# Patient Record
Sex: Male | Born: 1960 | Hispanic: No | Marital: Married | Smoking: Current some day smoker
Health system: Southern US, Community
[De-identification: ages and names within clinical notes are randomized; demographics above are authoritative.]

## PROBLEM LIST (undated history)

## (undated) DIAGNOSIS — A539 Syphilis, unspecified: Secondary | ICD-10-CM

## (undated) DIAGNOSIS — J189 Pneumonia, unspecified organism: Secondary | ICD-10-CM

## (undated) DIAGNOSIS — M1612 Unilateral primary osteoarthritis, left hip: Secondary | ICD-10-CM

## (undated) DIAGNOSIS — B20 Human immunodeficiency virus [HIV] disease: Secondary | ICD-10-CM

## (undated) DIAGNOSIS — B59 Pneumocystosis: Secondary | ICD-10-CM

## (undated) DIAGNOSIS — R739 Hyperglycemia, unspecified: Secondary | ICD-10-CM

## (undated) DIAGNOSIS — R7303 Prediabetes: Secondary | ICD-10-CM

## (undated) DIAGNOSIS — I10 Essential (primary) hypertension: Secondary | ICD-10-CM

## (undated) DIAGNOSIS — Z21 Asymptomatic human immunodeficiency virus [HIV] infection status: Secondary | ICD-10-CM

## (undated) HISTORY — PX: JOINT REPLACEMENT: SHX530

---

## 2018-12-15 ENCOUNTER — Other Ambulatory Visit: Payer: Self-pay | Admitting: Internal Medicine

## 2018-12-15 DIAGNOSIS — J8489 Other specified interstitial pulmonary diseases: Secondary | ICD-10-CM

## 2018-12-21 DIAGNOSIS — B59 Pneumocystosis: Secondary | ICD-10-CM | POA: Insufficient documentation

## 2018-12-24 ENCOUNTER — Ambulatory Visit: Admission: RE | Admit: 2018-12-24 | Payer: 59 | Source: Ambulatory Visit

## 2018-12-30 ENCOUNTER — Other Ambulatory Visit: Payer: Self-pay | Admitting: Infectious Diseases

## 2018-12-30 ENCOUNTER — Other Ambulatory Visit (HOSPITAL_COMMUNITY): Payer: Self-pay | Admitting: Infectious Diseases

## 2018-12-30 ENCOUNTER — Other Ambulatory Visit: Payer: Self-pay

## 2018-12-30 ENCOUNTER — Ambulatory Visit
Admission: RE | Admit: 2018-12-30 | Discharge: 2018-12-30 | Disposition: A | Discharge status: Home / Self Care | Payer: 59 | Source: Ambulatory Visit | Attending: Infectious Diseases | Admitting: Infectious Diseases

## 2018-12-30 DIAGNOSIS — B59 Pneumocystosis: Secondary | ICD-10-CM | POA: Insufficient documentation

## 2018-12-30 DIAGNOSIS — B20 Human immunodeficiency virus [HIV] disease: Secondary | ICD-10-CM | POA: Diagnosis present

## 2019-12-26 ENCOUNTER — Other Ambulatory Visit: Payer: 59 | Attending: Internal Medicine

## 2019-12-27 ENCOUNTER — Other Ambulatory Visit: Payer: Self-pay

## 2019-12-27 ENCOUNTER — Encounter: Payer: Self-pay | Admitting: Internal Medicine

## 2019-12-27 DIAGNOSIS — B20 Human immunodeficiency virus [HIV] disease: Secondary | ICD-10-CM | POA: Insufficient documentation

## 2019-12-27 DIAGNOSIS — A539 Syphilis, unspecified: Secondary | ICD-10-CM | POA: Insufficient documentation

## 2019-12-28 ENCOUNTER — Encounter: Admission: RE | Payer: Self-pay | Source: Home / Self Care

## 2019-12-28 ENCOUNTER — Ambulatory Visit: Admission: RE | Admit: 2019-12-28 | Payer: 59 | Source: Home / Self Care | Admitting: Internal Medicine

## 2019-12-28 HISTORY — DX: Syphilis, unspecified: A53.9

## 2019-12-28 HISTORY — DX: Asymptomatic human immunodeficiency virus (hiv) infection status: Z21

## 2019-12-28 HISTORY — DX: Human immunodeficiency virus (HIV) disease: B20

## 2019-12-28 SURGERY — COLONOSCOPY
Anesthesia: General

## 2020-09-10 IMAGING — CT CT CHEST W/O CM
2 of 3 series · 14 of 36 positions shown, 17 images · non-contrast
Comparison: Report from 12/10/2018 chest radiograph (images not
available).

CLINICAL DATA: Reported pneumonia for 2-3 weeks treated with
antibiotics. Cough and chest pain of improved. Nausea, vomiting and
anorexia persist. Former smoker. History of HIV.

EXAM:
CT CHEST WITHOUT CONTRAST
TECHNIQUE: Multidetector CT imaging of the chest was performed following the
standard protocol without IV contrast.

[Series 2: thorax · axial · 0.70mm/px · z∈[-301,-63]mm · 11 of 141 slices shown, 14 images]
[im 11/141  mediastinal]
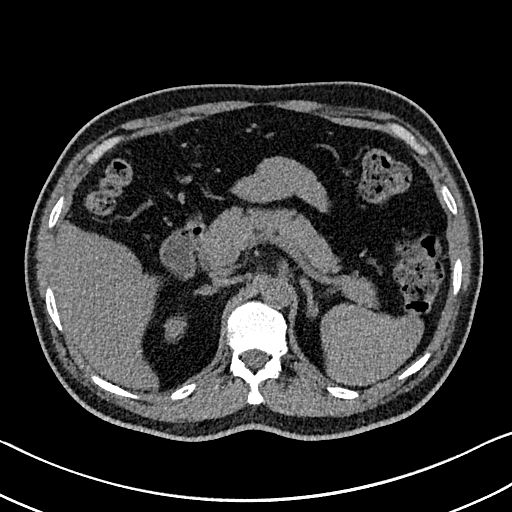
[im 11/141  lung]
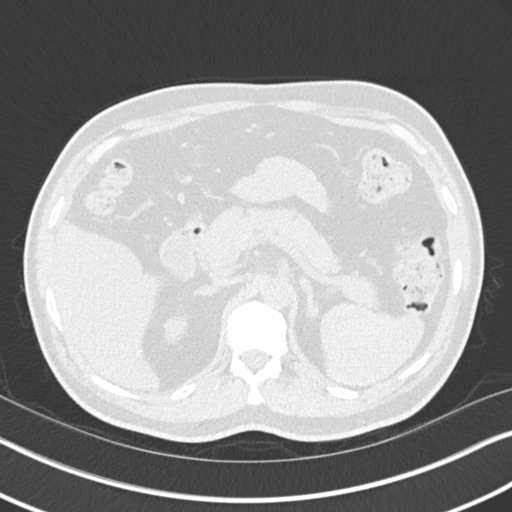
[im 21/141  lung]
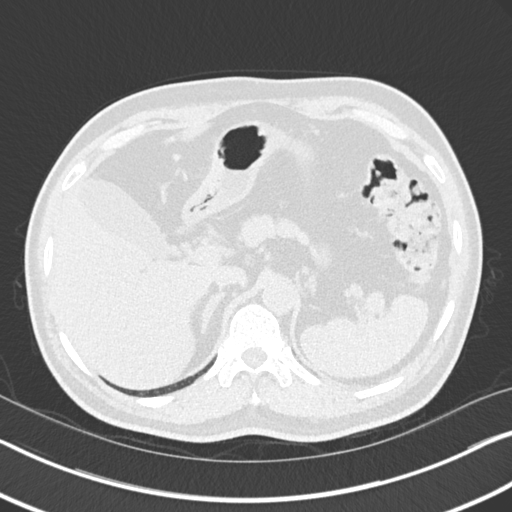
[im 32/141  lung]
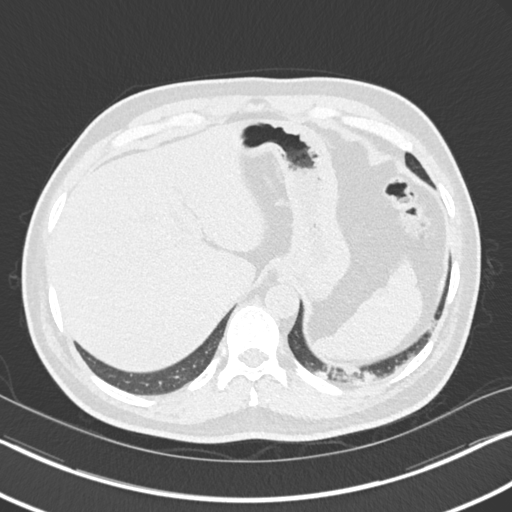
[im 47/141  lung]
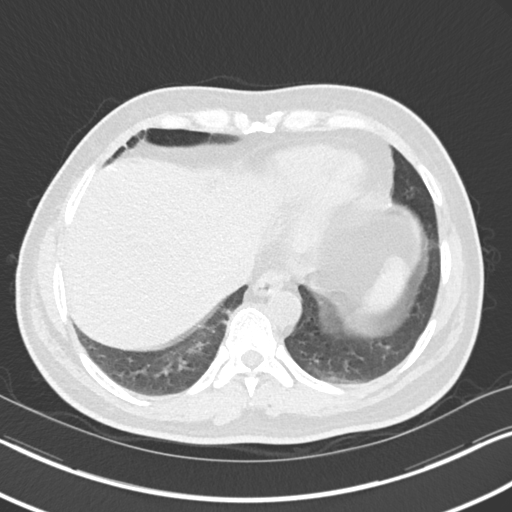
[im 58/141  mediastinal]
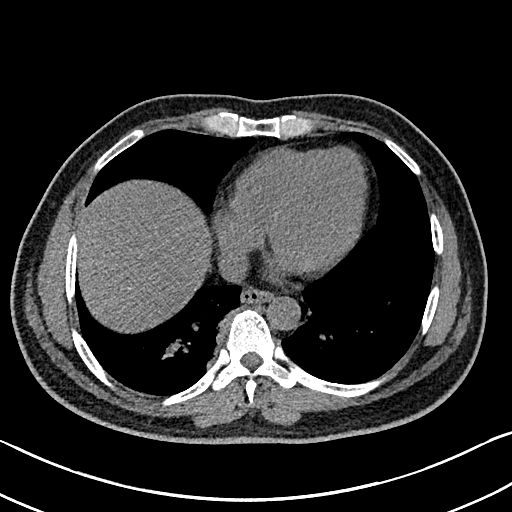
[im 58/141  lung]
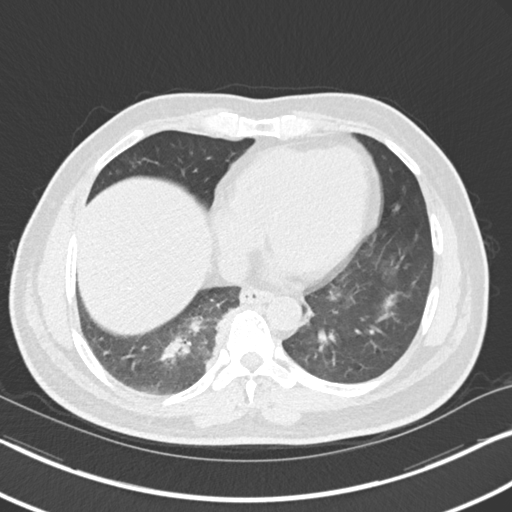
[im 73/141  lung]
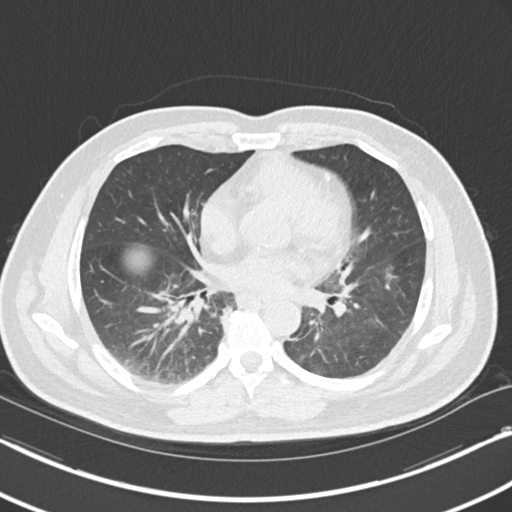
[im 83/141  lung]
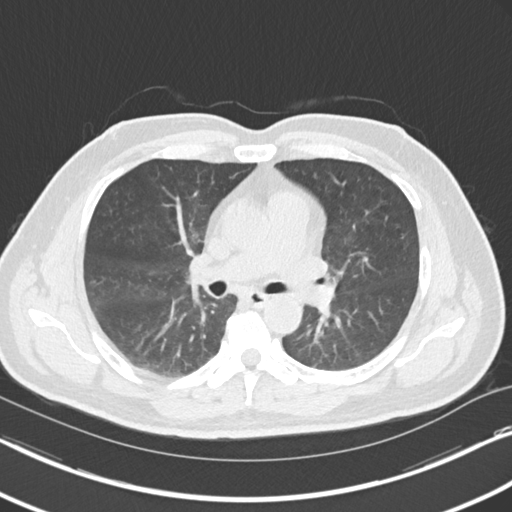
[im 94/141  lung]
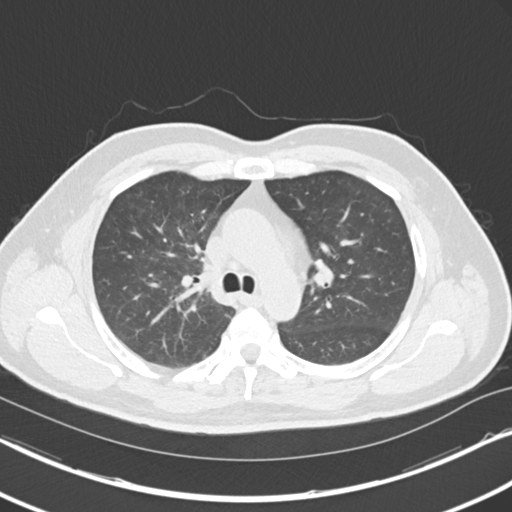
[im 109/141  mediastinal]
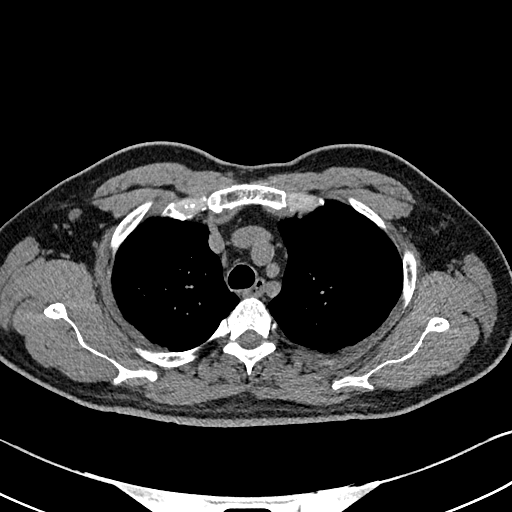
[im 109/141  lung]
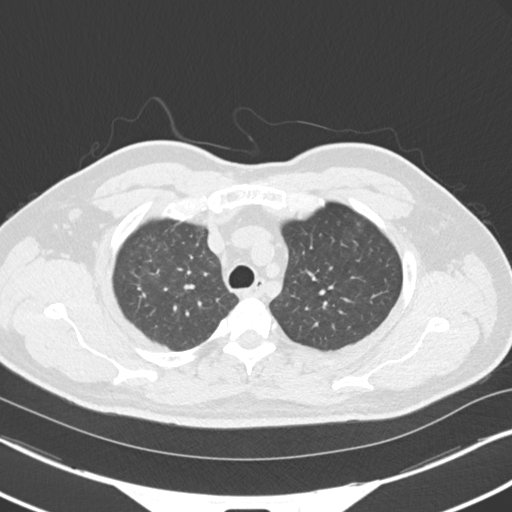
[im 120/141  lung]
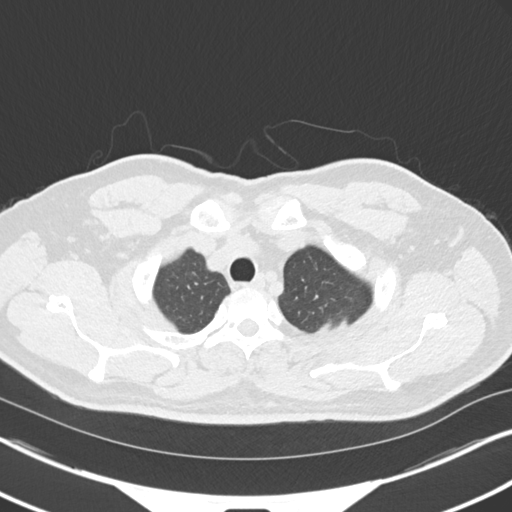
[im 130/141  lung]
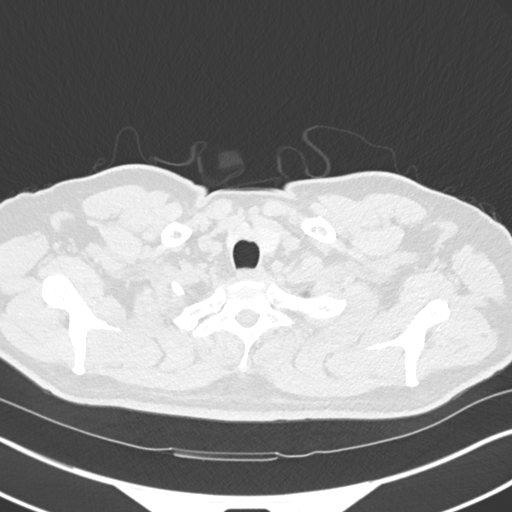

[Series 5: coronal · coronal · 0.56mm/px · 3 of 131 slices shown]
[im 27/131  lung]
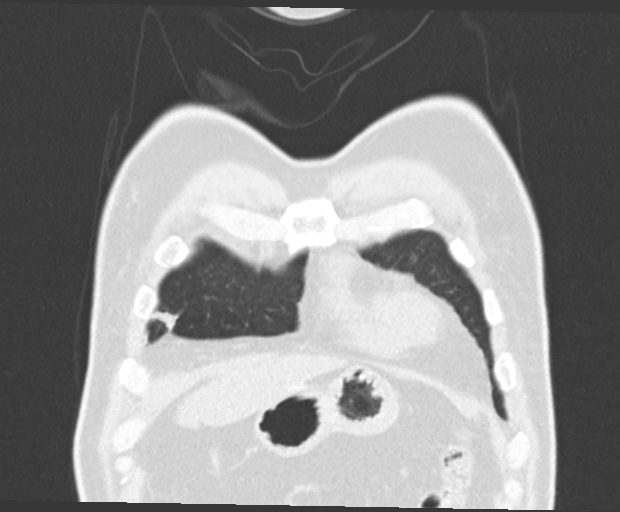
[im 53/131  lung]
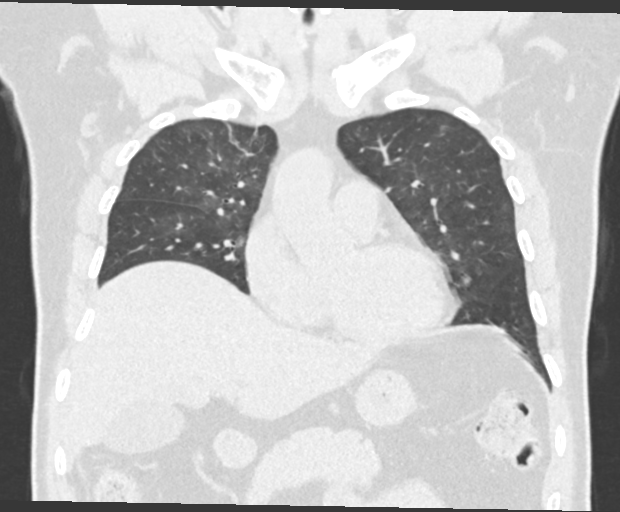
[im 79/131  lung]
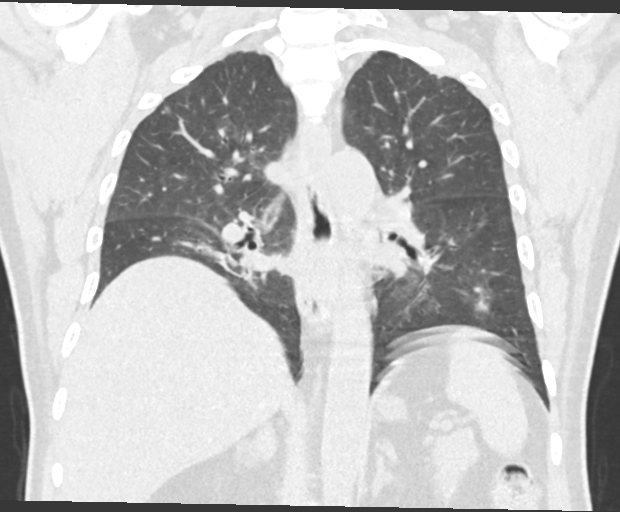

[14 of 36 positions shown; findings below may reference images not displayed]

FINDINGS: Cardiovascular: Normal heart size. No significant pericardial
effusion/thickening. Mildly atherosclerotic nonaneurysmal thoracic
aorta. Normal caliber pulmonary arteries.

Mediastinum/Nodes: No discrete thyroid nodules. Unremarkable
esophagus. Mildly enlarged bilateral axillary lymph nodes, largest
1.0 cm on the right (series 2/image 10) and 1.1 cm on the left
(series 2/image 9). No pathologically enlarged mediastinal or
discrete hilar nodes on this noncontrast scan.

Lungs/Pleura: No pneumothorax. No pleural effusion. There is diffuse
bronchial wall thickening. There is patchy peribronchovascular
ground-glass opacity throughout both lungs involving all lung lobes,
slightly asymmetrically prominent in the right lung, with mild
patchy peribronchovascular consolidation in the right lower lobe and
inferior right middle lobe (series 3/image 89). Bandlike patchy foci
of consolidation in the medial right lower lobe and basilar left
lower lobe. Apical right upper lobe 6 mm solid pulmonary nodule
(series 3/image 30).

Upper abdomen: Mild diffuse hepatic steatosis.

Musculoskeletal: No aggressive appearing focal osseous lesions. Mild
thoracic spondylosis.
IMPRESSION: 1. Diffuse bronchial wall thickening. Patchy peribronchovascular
ground-glass opacity throughout both lungs involving all lung lobes,
slightly asymmetrically prominent in the right lung. Small patchy
foci of consolidation in the right middle lobe and bilateral lower
lobes. Findings are most suggestive of a nonspecific multilobar
bronchopneumonia, with atypical etiologies considered including
pneumocystis pneumonia given the reported history of HIV.
2. Nonspecific mild bilateral axillary lymphadenopathy, potentially
reactive given the reported history of HIV.
3. Apical right upper lobe 6 mm solid pulmonary nodule. Non-contrast
chest CT at 6-12 months is recommended. If the nodule is stable at
time of repeat CT, then future CT at 18-24 months (from today's
scan) is considered optional for low-risk patients, but is
recommended for high-risk patients. This recommendation follows the
consensus statement: Guidelines for Management of Incidental
Pulmonary Nodules Detected on CT Images:From the [HOSPITAL]
7629; published online before print (10.1148/radiol.6112191947).
4. Diffuse hepatic steatosis.

Aortic Atherosclerosis ([id number]).

## 2021-09-05 ENCOUNTER — Other Ambulatory Visit: Payer: Self-pay | Admitting: Surgery

## 2021-09-10 ENCOUNTER — Other Ambulatory Visit: Payer: Self-pay

## 2021-09-10 ENCOUNTER — Encounter
Admission: RE | Admit: 2021-09-10 | Discharge: 2021-09-10 | Disposition: A | Discharge status: Home / Self Care | Payer: 59 | Source: Ambulatory Visit | Attending: Surgery | Admitting: Surgery

## 2021-09-10 VITALS — BP 137/87 | HR 66 | Temp 98.2°F | Resp 18 | Ht 65.0 in | Wt 171.2 lb

## 2021-09-10 DIAGNOSIS — Z21 Asymptomatic human immunodeficiency virus [HIV] infection status: Secondary | ICD-10-CM | POA: Diagnosis not present

## 2021-09-10 DIAGNOSIS — Z01818 Encounter for other preprocedural examination: Secondary | ICD-10-CM | POA: Diagnosis present

## 2021-09-10 DIAGNOSIS — M87051 Idiopathic aseptic necrosis of right femur: Secondary | ICD-10-CM | POA: Insufficient documentation

## 2021-09-10 LAB — COMPREHENSIVE METABOLIC PANEL
ALT: 18 U/L (ref 0–44)
AST: 17 U/L (ref 15–41)
Albumin: 4.2 g/dL (ref 3.5–5.0)
Alkaline Phosphatase: 87 U/L (ref 38–126)
Anion gap: 10 (ref 5–15)
BUN: 17 mg/dL (ref 6–20)
CO2: 26 mmol/L (ref 22–32)
Calcium: 9.6 mg/dL (ref 8.9–10.3)
Chloride: 105 mmol/L (ref 98–111)
Creatinine, Ser: 1.65 mg/dL — ABNORMAL HIGH (ref 0.61–1.24)
GFR, Estimated: 47 mL/min — ABNORMAL LOW (ref >60)
Glucose, Bld: 93 mg/dL (ref 70–99)
Potassium: 4 mmol/L (ref 3.5–5.1)
Sodium: 141 mmol/L (ref 135–145)
Total Bilirubin: 0.8 mg/dL (ref 0.3–1.2)
Total Protein: 8.7 g/dL — ABNORMAL HIGH (ref 6.5–8.1)

## 2021-09-10 LAB — CBC WITH DIFFERENTIAL/PLATELET
Abs Immature Granulocytes: 0.01 10*3/uL (ref 0.00–0.07)
Basophils Absolute: 0 10*3/uL (ref 0.0–0.1)
Basophils Relative: 1 %
Eosinophils Absolute: 0.1 10*3/uL (ref 0.0–0.5)
Eosinophils Relative: 2 %
HCT: 41.3 % (ref 39.0–52.0)
Hemoglobin: 14.1 g/dL (ref 13.0–17.0)
Immature Granulocytes: 0 %
Lymphocytes Relative: 26 %
Lymphs Abs: 1.6 10*3/uL (ref 0.7–4.0)
MCH: 30.2 pg (ref 26.0–34.0)
MCHC: 34.1 g/dL (ref 30.0–36.0)
MCV: 88.4 fL (ref 80.0–100.0)
Monocytes Absolute: 0.5 10*3/uL (ref 0.1–1.0)
Monocytes Relative: 8 %
Neutro Abs: 3.8 10*3/uL (ref 1.7–7.7)
Neutrophils Relative %: 63 %
Platelets: 263 10*3/uL (ref 150–400)
RBC: 4.67 MIL/uL (ref 4.22–5.81)
RDW: 12.9 % (ref 11.5–15.5)
WBC: 6 10*3/uL (ref 4.0–10.5)
nRBC: 0 % (ref 0.0–0.2)

## 2021-09-10 LAB — URINALYSIS, ROUTINE W REFLEX MICROSCOPIC
Bacteria, UA: NONE SEEN
Glucose, UA: NEGATIVE mg/dL
Hgb urine dipstick: NEGATIVE
Ketones, ur: NEGATIVE mg/dL
Leukocytes,Ua: NEGATIVE
Nitrite: NEGATIVE
Protein, ur: 100 mg/dL — AB
Specific Gravity, Urine: 1.02 (ref 1.005–1.030)
pH: 5 (ref 5.0–8.0)

## 2021-09-10 LAB — SURGICAL PCR SCREEN
MRSA, PCR: NEGATIVE
Staphylococcus aureus: NEGATIVE

## 2021-09-10 LAB — TYPE AND SCREEN
ABO/RH(D): O POS
Antibody Screen: NEGATIVE

## 2021-09-10 NOTE — Patient Instructions (Addendum)
Your procedure is scheduled on: Thursday 09/12/21 Report to the Registration Desk on the 1st floor of the [facility]. To find out your arrival time, please call [phone] between 1PM - 3PM on: Wednesday 09/11/21 If your arrival time is 6:00 am, do not arrive prior to that time as the [facility] entrance doors do not open until 6:00 am.  REMEMBER: Instructions that are not followed completely may result in serious medical risk, up to and including death; or upon the discretion of your surgeon and anesthesiologist your surgery may need to be rescheduled.  Do not eat food after midnight the night before surgery.  No gum chewing, lozengers or hard candies.  You may however, drink CLEAR liquids up to 2 hours before you are scheduled to arrive for your surgery. Do not drink anything within 2 hours of your scheduled arrival time.  Clear liquids include: - water  - apple juice without pulp - gatorade (not RED colors) - black coffee or tea (Do NOT add milk or creamers to the coffee or tea) Do NOT drink anything that is not on this list.  In addition, your doctor has ordered for you to drink the provided  Ensure Pre-Surgery Clear Carbohydrate Drink  Drinking this carbohydrate drink up to two hours before surgery helps to reduce insulin resistance and improve patient outcomes. Please complete drinking 2 hours prior to scheduled arrival time.  TAKE THESE MEDICATIONS THE MORNING OF SURGERY WITH A SIP OF WATER: AMLODIPINE BESYLATE PO  One week prior to surgery: Stop Anti-inflammatories (NSAIDS) such as Advil, Aleve, Ibuprofen, Motrin, Naproxen, Naprosyn and Aspirin based products such as Excedrin, Goodys Powder, BC Powder.  Stop taking your etodolac (LODINE) 500 MG tablet and ANY OVER THE COUNTER supplements until after surgery.  You may however, continue to take Tylenol if needed for pain up until the day of surgery.  No Alcohol for 24 hours before or after surgery.  No Smoking including  e-cigarettes for 24 hours prior to surgery.  No chewable tobacco products for at least 6 hours prior to surgery.  No nicotine patches on the day of surgery.  Do not use any "recreational" drugs for at least a week prior to your surgery.  Please be advised that the combination of cocaine and anesthesia may have negative outcomes, up to and including death. If you test positive for cocaine, your surgery will be cancelled.  On the morning of surgery brush your teeth with toothpaste and water, you may rinse your mouth with mouthwash if you wish. Do not swallow any toothpaste or mouthwash.  Use CHG Soap as directed on instruction sheet.  Do not wear jewelry.  Do not wear lotions, powders, or colognes.   Do not shave body from the neck down 48 hours prior to surgery just in case you cut yourself which could leave a site for infection.  Also, freshly shaved skin may become irritated if using the CHG soap.  dentures may not be worn into surgery.  Do not bring valuables to the hospital. [facility] is not responsible for any missing/lost belongings or valuables.   Notify your doctor if there is any change in your medical condition (cold, fever, infection).  Wear comfortable clothing (specific to your surgery type) to the hospital.  After surgery, you can help prevent lung complications by doing breathing exercises.  Take deep breaths and cough every 1-2 hours. Your doctor may order a device called an Incentive Spirometer to help you take deep breaths.  If you are being admitted to the hospital overnight, leave your suitcase in the car. After surgery it may be brought to your room.  If you are being discharged the day of surgery, you will not be allowed to drive home. You will need a responsible adult (18 years or older) to drive you home and stay with you that night.   If you are taking public transportation, you will need to have a responsible adult (18 years or older) with you. Please  confirm with your physician that it is acceptable to use public transportation.   Please call the Pre-admissions Testing Dept. at [phone] if you have any questions about these instructions.  Surgery Visitation Policy:  Patients undergoing a surgery or procedure may have two family members or support persons with them as long as the person is not COVID-19 positive or experiencing its symptoms.   Inpatient Visitation:    Visiting hours are 7 a.m. to 8 p.m. Up to four visitors are allowed at one time in a patient room, including children. The visitors may rotate out with other people during the day. One designated support person (adult) may remain overnight.

## 2021-09-10 NOTE — Progress Notes (Signed)
  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 Leron Croak, MD. in preparation for his procedure, patient presented to the PAT clinic on 09/10/2021 for preoperative labs and ECG.  In review of the ECG, patient with presumably acute lateral TWIs (1, aVL, V4-V6).  Unfortunately, there are no previous ECG tracings available in the [facility] or [facility] systems for comparison. In brief review, it was noted that patient complained of exertional dyspnea when establishing care with Dr. Marcello Fennel, MD back on 07/06/2018.  Stress test was recommended at that time, however I do not see evidence of any noninvasive cardiovascular testing.   Patient with an HIV diagnosis.  Care was transitioned to Dr. Clydie Braun, MD.     Impression and Plan:  Donald Barnett noted to have presumably acute lateral ECG changes on preoperative ECG.  Patient does not see cardiology.  Copy of ECG forwarded to Dr. Sampson Goon for review.  We are requesting recommendations regarding whether patient is deemed to be appropriate to proceed with planned elective surgical intervention, or if he should be seen by cardiology for further evaluation and clearance prior to proceeding.    Spoke to internal medicine office staff around 1700 today and learned that MD already out of the office for the day. Plans are to attempt to get patient into the clinic tomorrow to see Dr. Sampson Goon and/or cardiology for evaluation.  Disposition regarding surgery pending.   No changes being made to the surgical schedule at this time pending decision from medicine or cardiology. Copy of this note forwarded to primary attending surgeon Joice Lofts, MD) in efforts to ensure interdisciplinary communication.  Quentin Mulling, MSN, APRN, FNP-C, CEN [facility]  Peri-operative Services Nurse Practitioner Phone: [phone] 09/10/21 5:51 PM  NOTE: This note has been prepared using Dragon dictation software. Despite my best ability to proofread, there is always the potential that unintentional transcriptional errors may still occur from this process.

## 2021-09-11 MED ORDER — LACTATED RINGERS IV SOLN: INTRAVENOUS | Status: DC

## 2021-09-11 MED ORDER — ORAL CARE MOUTH RINSE: 15.0000 mL | Freq: Once | OROMUCOSAL | Status: AC

## 2021-09-11 MED ORDER — CHLORHEXIDINE GLUCONATE 0.12 % MT SOLN: 15.0000 mL | Freq: Once | OROMUCOSAL | Status: AC

## 2021-09-11 MED ORDER — CEFAZOLIN SODIUM-DEXTROSE 2-4 GM/100ML-% IV SOLN
2.0000 g | INTRAVENOUS | Status: AC
  Administered 2021-09-12: 2 g via INTRAVENOUS

## 2021-09-11 MED ORDER — FAMOTIDINE 20 MG PO TABS: 20.0000 mg | ORAL_TABLET | Freq: Once | ORAL | Status: AC

## 2021-09-11 NOTE — Progress Notes (Addendum)
  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 Milagros Evener, MD. in preparation for his procedure, patient presented to the PAT clinic on 09/10/2021 for preoperative testing.  Lateral changes noted to patient's preoperative ECG, however there was no previous tracing available for comparison.  Tracing was sent to PCP for review, as patient has no cardiologist.  PCP elected to meet with patient via telehealth visit today to discuss ECG and determine presence of any symptoms.  Patient met with Dr. Adrian Prows, MD on 09/11/2021.  MD reviewed ECG changes with patient.  Per Dr. Ola Spurr, "patient has no cardiopulmonary complaints.  He is able to work carrying 30 to 40 pounds of weight 10 m repeatedly as part of his job.  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  No current concern for further cardiac work-up.  Patient is optimized for surgery and may proceed at an overall LOW risk of significant perioperative cardiovascular complications".  Impression and Plan:  [name] with presumably acute lateral ECG changes.  Patient met with his internal medicine provider today via telehealth visit.  Patient asymptomatic from a cardiovascular perspective.  Patient has been cleared at an LOW risk for significant cardiovascular complications.  Will update primary attending surgeons office on clearance and plans to proceed as previously scheduled.  [name], MSN, APRN, FNP-C, CEN [facility]  Peri-operative Services Nurse Practitioner Phone: [phone] 09/11/21 1:41 PM  NOTE: This note has been prepared using Dragon dictation software. Despite my best ability to proofread, there is always the potential that unintentional transcriptional errors may still occur from this process.

## 2021-09-12 ENCOUNTER — Other Ambulatory Visit: Payer: Self-pay

## 2021-09-12 ENCOUNTER — Encounter
Admission: RE | Disposition: A | Discharge status: Home Health | Payer: Self-pay | Source: Home / Self Care | Attending: Surgery

## 2021-09-12 ENCOUNTER — Observation Stay
Admission: RE | Admit: 2021-09-12 | Discharge: 2021-09-13 | Disposition: A | Discharge status: Home Health | Payer: 59 | Attending: Surgery | Admitting: Surgery

## 2021-09-12 ENCOUNTER — Observation Stay: Payer: 59

## 2021-09-12 ENCOUNTER — Ambulatory Visit: Payer: 59 | Admitting: Urgent Care

## 2021-09-12 DIAGNOSIS — Z79899 Other long term (current) drug therapy: Secondary | ICD-10-CM | POA: Insufficient documentation

## 2021-09-12 DIAGNOSIS — Z96641 Presence of right artificial hip joint: Secondary | ICD-10-CM

## 2021-09-12 DIAGNOSIS — M87051 Idiopathic aseptic necrosis of right femur: Secondary | ICD-10-CM | POA: Diagnosis present

## 2021-09-12 DIAGNOSIS — F17210 Nicotine dependence, cigarettes, uncomplicated: Secondary | ICD-10-CM | POA: Diagnosis not present

## 2021-09-12 DIAGNOSIS — B20 Human immunodeficiency virus [HIV] disease: Secondary | ICD-10-CM | POA: Insufficient documentation

## 2021-09-12 HISTORY — PX: TOTAL HIP ARTHROPLASTY: SHX124

## 2021-09-12 LAB — ABO/RH: ABO/RH(D): O POS

## 2021-09-12 SURGERY — ARTHROPLASTY, HIP, TOTAL,POSTERIOR APPROACH
Anesthesia: Spinal | Site: Hip | Laterality: Right

## 2021-09-12 MED ORDER — SODIUM CHLORIDE 0.9 % IR SOLN
Status: DC | PRN
  Administered 2021-09-12: 3000 mL

## 2021-09-12 MED ORDER — CEFAZOLIN SODIUM-DEXTROSE 2-4 GM/100ML-% IV SOLN
2.0000 g | Freq: Four times a day (QID) | INTRAVENOUS | Status: AC
  Administered 2021-09-12 – 2021-09-13 (×3): 2 g via INTRAVENOUS
  Filled 2021-09-12 (×2): qty 100

## 2021-09-12 MED ORDER — METOCLOPRAMIDE HCL 10 MG PO TABS: 5.0000 mg | ORAL_TABLET | Freq: Three times a day (TID) | ORAL | Status: DC | PRN

## 2021-09-12 MED ORDER — SODIUM CHLORIDE (PF) 0.9 % IJ SOLN
INTRAMUSCULAR | Status: DC | PRN
  Administered 2021-09-12: 90 mL

## 2021-09-12 MED ORDER — AMLODIPINE BESYLATE 5 MG PO TABS
5.0000 mg | ORAL_TABLET | Freq: Every day | ORAL | Status: DC
  Administered 2021-09-13: 5 mg via ORAL
  Filled 2021-09-12: qty 1

## 2021-09-12 MED ORDER — PHENYLEPHRINE HCL-NACL 20-0.9 MG/250ML-% IV SOLN
INTRAVENOUS | Status: AC
  Filled 2021-09-12: qty 250

## 2021-09-12 MED ORDER — LIDOCAINE HCL (PF) 2 % IJ SOLN
INTRAMUSCULAR | Status: AC
  Filled 2021-09-12: qty 5

## 2021-09-12 MED ORDER — DOCUSATE SODIUM 100 MG PO CAPS
100.0000 mg | ORAL_CAPSULE | Freq: Two times a day (BID) | ORAL | Status: DC
  Administered 2021-09-12 – 2021-09-13 (×2): 100 mg via ORAL
  Filled 2021-09-12 (×2): qty 1

## 2021-09-12 MED ORDER — MAGNESIUM HYDROXIDE 400 MG/5ML PO SUSP: 30.0000 mL | Freq: Every day | ORAL | Status: DC | PRN

## 2021-09-12 MED ORDER — ACETAMINOPHEN 500 MG PO TABS
1000.0000 mg | ORAL_TABLET | Freq: Four times a day (QID) | ORAL | Status: DC
  Administered 2021-09-12 – 2021-09-13 (×3): 1000 mg via ORAL
  Filled 2021-09-12 (×4): qty 2

## 2021-09-12 MED ORDER — PROMETHAZINE HCL 25 MG/ML IJ SOLN: 6.2500 mg | INTRAMUSCULAR | Status: DC | PRN

## 2021-09-12 MED ORDER — TRAMADOL HCL 50 MG PO TABS: 50.0000 mg | ORAL_TABLET | Freq: Four times a day (QID) | ORAL | Status: DC | PRN

## 2021-09-12 MED ORDER — APIXABAN 2.5 MG PO TABS
2.5000 mg | ORAL_TABLET | Freq: Two times a day (BID) | ORAL | Status: DC
  Administered 2021-09-13: 2.5 mg via ORAL
  Filled 2021-09-12: qty 1

## 2021-09-12 MED ORDER — PHENYLEPHRINE HCL-NACL 20-0.9 MG/250ML-% IV SOLN
INTRAVENOUS | Status: DC | PRN
  Administered 2021-09-12: 40 ug/min via INTRAVENOUS

## 2021-09-12 MED ORDER — KETOROLAC TROMETHAMINE 30 MG/ML IJ SOLN
INTRAMUSCULAR | Status: AC
  Filled 2021-09-12: qty 1

## 2021-09-12 MED ORDER — MIDAZOLAM HCL 2 MG/2ML IJ SOLN
INTRAMUSCULAR | Status: AC
  Filled 2021-09-12: qty 2

## 2021-09-12 MED ORDER — HYDROMORPHONE HCL 1 MG/ML IJ SOLN: 0.5000 mg | INTRAMUSCULAR | Status: DC | PRN

## 2021-09-12 MED ORDER — FENTANYL CITRATE (PF) 100 MCG/2ML IJ SOLN
25.0000 ug | INTRAMUSCULAR | Status: DC | PRN
  Administered 2021-09-12 (×2): 50 ug via INTRAVENOUS

## 2021-09-12 MED ORDER — ACETAMINOPHEN 10 MG/ML IV SOLN: 1000.0000 mg | Freq: Once | INTRAVENOUS | Status: DC | PRN

## 2021-09-12 MED ORDER — BUPIVACAINE HCL (PF) 0.5 % IJ SOLN
INTRAMUSCULAR | Status: DC | PRN
  Administered 2021-09-12: 3 mL via INTRATHECAL

## 2021-09-12 MED ORDER — SODIUM CHLORIDE 0.9 % IV SOLN: INTRAVENOUS | Status: DC

## 2021-09-12 MED ORDER — KETOROLAC TROMETHAMINE 30 MG/ML IJ SOLN
30.0000 mg | Freq: Once | INTRAMUSCULAR | Status: AC
  Administered 2021-09-12: 30 mg via INTRAVENOUS

## 2021-09-12 MED ORDER — FLEET ENEMA 7-19 GM/118ML RE ENEM: 1.0000 | ENEMA | Freq: Once | RECTAL | Status: DC | PRN

## 2021-09-12 MED ORDER — METOCLOPRAMIDE HCL 5 MG/ML IJ SOLN: 5.0000 mg | Freq: Three times a day (TID) | INTRAMUSCULAR | Status: DC | PRN

## 2021-09-12 MED ORDER — TRANEXAMIC ACID 1000 MG/10ML IV SOLN
INTRAVENOUS | Status: DC | PRN
  Administered 2021-09-12: 1000 mg via TOPICAL

## 2021-09-12 MED ORDER — MIDAZOLAM HCL 5 MG/5ML IJ SOLN
INTRAMUSCULAR | Status: DC | PRN
  Administered 2021-09-12 (×2): 1 mg via INTRAVENOUS

## 2021-09-12 MED ORDER — CEFAZOLIN SODIUM-DEXTROSE 2-4 GM/100ML-% IV SOLN
INTRAVENOUS | Status: AC
  Filled 2021-09-12: qty 100

## 2021-09-12 MED ORDER — ACETAMINOPHEN 325 MG PO TABS
325.0000 mg | ORAL_TABLET | Freq: Four times a day (QID) | ORAL | Status: DC | PRN
  Administered 2021-09-12: 500 mg via ORAL

## 2021-09-12 MED ORDER — BICTEGRAVIR-EMTRICITAB-TENOFOV 50-200-25 MG PO TABS
1.0000 | ORAL_TABLET | Freq: Every day | ORAL | Status: DC
  Administered 2021-09-12 – 2021-09-13 (×2): 1 via ORAL
  Filled 2021-09-12 (×3): qty 1

## 2021-09-12 MED ORDER — ONDANSETRON HCL 4 MG/2ML IJ SOLN
INTRAMUSCULAR | Status: DC | PRN
  Administered 2021-09-12: 4 mg via INTRAVENOUS

## 2021-09-12 MED ORDER — PROPOFOL 1000 MG/100ML IV EMUL
INTRAVENOUS | Status: AC
  Filled 2021-09-12: qty 100

## 2021-09-12 MED ORDER — BUPIVACAINE-EPINEPHRINE (PF) 0.5% -1:200000 IJ SOLN
INTRAMUSCULAR | Status: AC
  Filled 2021-09-12: qty 30

## 2021-09-12 MED ORDER — FAMOTIDINE 20 MG PO TABS
ORAL_TABLET | ORAL | Status: AC
  Administered 2021-09-12: 20 mg via ORAL
  Filled 2021-09-12: qty 1

## 2021-09-12 MED ORDER — PROPOFOL 500 MG/50ML IV EMUL
INTRAVENOUS | Status: DC | PRN
  Administered 2021-09-12: 135 ug/kg/min via INTRAVENOUS

## 2021-09-12 MED ORDER — BUPIVACAINE LIPOSOME 1.3 % IJ SUSP
INTRAMUSCULAR | Status: AC
  Filled 2021-09-12: qty 20

## 2021-09-12 MED ORDER — DROPERIDOL 2.5 MG/ML IJ SOLN: 0.6250 mg | Freq: Once | INTRAMUSCULAR | Status: DC | PRN

## 2021-09-12 MED ORDER — FENTANYL CITRATE (PF) 100 MCG/2ML IJ SOLN
INTRAMUSCULAR | Status: AC
  Filled 2021-09-12: qty 2

## 2021-09-12 MED ORDER — EPHEDRINE SULFATE (PRESSORS) 50 MG/ML IJ SOLN
INTRAMUSCULAR | Status: DC | PRN
  Administered 2021-09-12 (×2): 5 mg via INTRAVENOUS

## 2021-09-12 MED ORDER — ACETAMINOPHEN 10 MG/ML IV SOLN
INTRAVENOUS | Status: AC
  Filled 2021-09-12: qty 100

## 2021-09-12 MED ORDER — TRANEXAMIC ACID 1000 MG/10ML IV SOLN
INTRAVENOUS | Status: AC
  Filled 2021-09-12: qty 10

## 2021-09-12 MED ORDER — OXYCODONE HCL 5 MG PO TABS
5.0000 mg | ORAL_TABLET | Freq: Once | ORAL | Status: AC | PRN
  Administered 2021-09-12: 5 mg via ORAL

## 2021-09-12 MED ORDER — OXYCODONE HCL 5 MG PO TABS
5.0000 mg | ORAL_TABLET | ORAL | Status: DC | PRN
  Administered 2021-09-13: 5 mg via ORAL
  Filled 2021-09-12: qty 1

## 2021-09-12 MED ORDER — OXYCODONE HCL 5 MG PO TABS
ORAL_TABLET | ORAL | Status: AC
  Filled 2021-09-12: qty 1

## 2021-09-12 MED ORDER — ONDANSETRON HCL 4 MG PO TABS: 4.0000 mg | ORAL_TABLET | Freq: Four times a day (QID) | ORAL | Status: DC | PRN

## 2021-09-12 MED ORDER — DIPHENHYDRAMINE HCL 12.5 MG/5ML PO ELIX: 12.5000 mg | ORAL_SOLUTION | ORAL | Status: DC | PRN

## 2021-09-12 MED ORDER — PROPOFOL 10 MG/ML IV BOLUS
INTRAVENOUS | Status: AC
  Filled 2021-09-12: qty 20

## 2021-09-12 MED ORDER — TRANEXAMIC ACID-NACL 1000-0.7 MG/100ML-% IV SOLN
INTRAVENOUS | Status: AC
  Filled 2021-09-12: qty 100

## 2021-09-12 MED ORDER — 0.9 % SODIUM CHLORIDE (POUR BTL) OPTIME
TOPICAL | Status: DC | PRN
  Administered 2021-09-12: 500 mL

## 2021-09-12 MED ORDER — CHLORHEXIDINE GLUCONATE 0.12 % MT SOLN
OROMUCOSAL | Status: AC
  Administered 2021-09-12: 15 mL via OROMUCOSAL
  Filled 2021-09-12: qty 15

## 2021-09-12 MED ORDER — PROPOFOL 10 MG/ML IV BOLUS
INTRAVENOUS | Status: DC | PRN
  Administered 2021-09-12: 30 mg via INTRAVENOUS

## 2021-09-12 MED ORDER — OXYCODONE HCL 5 MG/5ML PO SOLN: 5.0000 mg | Freq: Once | ORAL | Status: AC | PRN

## 2021-09-12 MED ORDER — ONDANSETRON HCL 4 MG/2ML IJ SOLN
INTRAMUSCULAR | Status: AC
  Filled 2021-09-12: qty 2

## 2021-09-12 MED ORDER — SODIUM CHLORIDE FLUSH 0.9 % IV SOLN
INTRAVENOUS | Status: AC
  Filled 2021-09-12: qty 40

## 2021-09-12 MED ORDER — ONDANSETRON HCL 4 MG/2ML IJ SOLN: 4.0000 mg | Freq: Four times a day (QID) | INTRAMUSCULAR | Status: DC | PRN

## 2021-09-12 MED ORDER — ACETAMINOPHEN 10 MG/ML IV SOLN
INTRAVENOUS | Status: DC | PRN
  Administered 2021-09-12: 1000 mg via INTRAVENOUS

## 2021-09-12 MED ORDER — FENTANYL CITRATE (PF) 100 MCG/2ML IJ SOLN
INTRAMUSCULAR | Status: DC | PRN
  Administered 2021-09-12: 50 ug via INTRAVENOUS
  Administered 2021-09-12 (×2): 25 ug via INTRAVENOUS

## 2021-09-12 MED ORDER — PHENYLEPHRINE 80 MCG/ML (10ML) SYRINGE FOR IV PUSH (FOR BLOOD PRESSURE SUPPORT)
PREFILLED_SYRINGE | INTRAVENOUS | Status: DC | PRN
  Administered 2021-09-12 (×2): 80 ug via INTRAVENOUS
  Administered 2021-09-12: 160 ug via INTRAVENOUS

## 2021-09-12 MED ORDER — BISACODYL 10 MG RE SUPP: 10.0000 mg | Freq: Every day | RECTAL | Status: DC | PRN

## 2021-09-12 MED ORDER — KETOROLAC TROMETHAMINE 15 MG/ML IJ SOLN
15.0000 mg | Freq: Four times a day (QID) | INTRAMUSCULAR | Status: AC
  Administered 2021-09-12 – 2021-09-13 (×4): 15 mg via INTRAVENOUS
  Filled 2021-09-12 (×4): qty 1

## 2021-09-12 SURGICAL SUPPLY — 57 items
BLADE SAGITTAL WIDE XTHICK NO (BLADE) ×2 IMPLANT
BLADE SURG SZ20 CARB STEEL (BLADE) ×2 IMPLANT
CHLORAPREP W/TINT 26 (MISCELLANEOUS) ×3 IMPLANT
DRAPE 3/4 80X56 (DRAPES) ×2 IMPLANT
DRAPE IMP U-DRAPE 54X76 (DRAPES) IMPLANT
DRAPE INCISE IOBAN 66X60 STRL (DRAPES) ×2 IMPLANT
DRAPE SURG 17X11 SM STRL (DRAPES) ×4 IMPLANT
DRSG MEPILEX SACRM 8.7X9.8 (GAUZE/BANDAGES/DRESSINGS) ×2 IMPLANT
DRSG OPSITE POSTOP 4X10 (GAUZE/BANDAGES/DRESSINGS) ×2 IMPLANT
ELECT CAUTERY BLADE 6.4 (BLADE) ×2 IMPLANT
GAUZE 4X4 16PLY ~~LOC~~+RFID DBL (SPONGE) ×2 IMPLANT
GAUZE XEROFORM 1X8 LF (GAUZE/BANDAGES/DRESSINGS) ×2 IMPLANT
GLOVE BIO SURGEON STRL SZ7.5 (GLOVE) ×8 IMPLANT
GLOVE BIO SURGEON STRL SZ8 (GLOVE) ×8 IMPLANT
GLOVE BIOGEL PI IND STRL 8 (GLOVE) ×1 IMPLANT
GLOVE BIOGEL PI INDICATOR 8 (GLOVE) ×1
GLOVE SURG UNDER LTX SZ8 (GLOVE) ×2 IMPLANT
GOWN STRL REUS W/ TWL LRG LVL3 (GOWN DISPOSABLE) ×1 IMPLANT
GOWN STRL REUS W/ TWL XL LVL3 (GOWN DISPOSABLE) ×1 IMPLANT
GOWN STRL REUS W/TWL LRG LVL3 (GOWN DISPOSABLE) ×1
GOWN STRL REUS W/TWL XL LVL3 (GOWN DISPOSABLE) ×1
HEAD CERAMIC BIOLOX 36 T1 STD (Head) ×1 IMPLANT
HOLSTER ELECTROSUGICAL PENCIL (MISCELLANEOUS) ×2 IMPLANT
HOOD PEEL AWAY FLYTE STAYCOOL (MISCELLANEOUS) ×6 IMPLANT
IV NS IRRIG 3000ML ARTHROMATIC (IV SOLUTION) ×2 IMPLANT
KIT TURNOVER KIT A (KITS) ×2 IMPLANT
LINER ACE G7 36 SZF HIGH WALL (Liner) ×1 IMPLANT
MANIFOLD NEPTUNE II (INSTRUMENTS) ×2 IMPLANT
MAT ABSORB  FLUID 56X50 GRAY (MISCELLANEOUS)
MAT ABSORB FLUID 56X50 GRAY (MISCELLANEOUS) ×1 IMPLANT
NDL FILTER BLUNT 18X1 1/2 (NEEDLE) ×1 IMPLANT
NDL SAFETY ECLIPSE 18X1.5 (NEEDLE) ×2 IMPLANT
NDL SPNL 20GX3.5 QUINCKE YW (NEEDLE) ×1 IMPLANT
NEEDLE FILTER BLUNT 18X 1/2SAF (NEEDLE) ×1
NEEDLE FILTER BLUNT 18X1 1/2 (NEEDLE) ×1 IMPLANT
NEEDLE HYPO 18GX1.5 SHARP (NEEDLE) ×2
NEEDLE SPNL 20GX3.5 QUINCKE YW (NEEDLE) ×2 IMPLANT
PACK HIP PROSTHESIS (MISCELLANEOUS) ×2 IMPLANT
PENCIL SMOKE EVACUATOR (MISCELLANEOUS) ×2 IMPLANT
PIN STEINMAN 3/16 (PIN) ×2 IMPLANT
PULSAVAC PLUS IRRIG FAN TIP (DISPOSABLE) ×2
SHELL ACETABULAR 3H 54MM F (Shell) ×1 IMPLANT
SPONGE T-LAP 18X18 ~~LOC~~+RFID (SPONGE) ×9 IMPLANT
STAPLER SKIN PROX 35W (STAPLE) ×2 IMPLANT
STEM FEMORAL SZ12 140MM (Stem) ×1 IMPLANT
SUT TICRON 2-0 30IN 311381 (SUTURE) ×6 IMPLANT
SUT VIC AB 0 CT1 36 (SUTURE) ×2 IMPLANT
SUT VIC AB 1 CT1 36 (SUTURE) ×2 IMPLANT
SUT VIC AB 2-0 CT1 (SUTURE) ×6 IMPLANT
SYR 10ML LL (SYRINGE) ×2 IMPLANT
SYR 20ML LL LF (SYRINGE) ×2 IMPLANT
SYR 30ML LL (SYRINGE) ×4 IMPLANT
TAPE TRANSPORE STRL 2 31045 (GAUZE/BANDAGES/DRESSINGS) ×2 IMPLANT
TIP FAN IRRIG PULSAVAC PLUS (DISPOSABLE) ×1 IMPLANT
TRAP FLUID SMOKE EVACUATOR (MISCELLANEOUS) ×2 IMPLANT
WATER STERILE IRR 1000ML POUR (IV SOLUTION) ×2 IMPLANT
WATER STERILE IRR 500ML POUR (IV SOLUTION) ×2 IMPLANT

## 2021-09-12 NOTE — Anesthesia Procedure Notes (Addendum)
Spinal  Patient location during procedure: OR Start time: 09/12/2021 7:35 AM End time: 09/12/2021 7:39 AM Reason for block: surgical anesthesia Staffing Performed: anesthesiologist and resident/CRNA  Anesthesiologist: Foye Deer, MD Resident/CRNA: Tammi Klippel, CRNA Performed by: Foye Deer, MD Authorized by: Foye Deer, MD   Preanesthetic Checklist Completed: patient identified, IV checked, site marked, risks and benefits discussed, surgical consent, monitors and equipment checked, pre-op evaluation and timeout performed Spinal Block Patient position: sitting Prep: DuraPrep Patient monitoring: heart rate, cardiac monitor, continuous pulse ox and blood pressure Approach: midline Location: L2-3 Injection technique: single-shot Needle Needle type: Pencan  Needle gauge: 24 G Needle length: 9 cm Assessment Sensory level: T10 Events: CSF return Additional Notes First attempt by CRNA. Second by anesthesiologist.

## 2021-09-12 NOTE — Evaluation (Signed)
Physical Therapy Evaluation Patient Details Name: Donald Barnett MRN: [id number] DOB: 06/27/1960 Today's Date: 09/12/2021  History of Present Illness  Pt is 61 y.o. male s/p R THA on 09/10/21.  Pt has PMH including HIV, Syphilis, and Renal Insufficiency.    Clinical Impression  Pt received in Semi-Fowler's position and agreeable to therapy.  Pt's daughter in room and translated for therapist throughout the session.  Pt doing well with pain management and is able to participate in transfer training with good technique throughout.  Pt notes that his pain is much better in standing and walking than it is in sitting and in supine position.  Pt is able to ambulate ~130' before becoming fatigued and needing to return to EOB.  Pt requested to sit at EOB for a little while.  Nursing notified and bed alarm set.  Current discharge plans to home with HHPT are appropriate as long as pt is able to manage stairs.  Pt will continue to benefit from skilled therapy in order to address deficits listed below.      Recommendations for follow up therapy are one component of a multi-disciplinary discharge planning process, led by the attending physician.  Recommendations may be updated based on patient status, additional functional criteria and insurance authorization.  Follow Up Recommendations Home health PT    Assistance Recommended at Discharge Intermittent Supervision/Assistance  Patient can return home with the following  A little help with walking and/or transfers;A little help with bathing/dressing/bathroom    Equipment Recommendations Rolling walker (2 wheels);BSC/3in1  Recommendations for Other Services       Functional Status Assessment Patient has had a recent decline in their functional status and demonstrates the ability to make significant improvements in function in a reasonable and predictable amount of time.     Precautions / Restrictions Precautions Precautions: Posterior  Hip Precaution Booklet Issued: Yes (comment) Precaution Comments: R THA Restrictions Weight Bearing Restrictions: Yes RLE Weight Bearing: Weight bearing as tolerated      Mobility  Bed Mobility Overal bed mobility: Modified Independent             General bed mobility comments: utilizes handrails and increased time.    Transfers Overall transfer level: Modified independent Equipment used: Rolling walker (2 wheels)               General transfer comment: Pt able to stand with good technique and has good balance.    Ambulation/Gait Ambulation/Gait assistance: Min guard Gait Distance (Feet): 130 Feet Assistive device: Rolling walker (2 wheels) Gait Pattern/deviations: Step-through pattern, Decreased step length - left, Decreased stance time - right, Decreased stride length Gait velocity: decreased     General Gait Details: Pt with slow but steady cadence throughout ambulation.  Stairs            Wheelchair Mobility    Modified Rankin (Stroke Patients Only)       Balance                                             Pertinent Vitals/Pain Pain Assessment Pain Assessment: 0-10 Pain Score: 5  Pain Location: Hip Pain Descriptors / Indicators: Aching Pain Intervention(s): Limited activity within patient's tolerance, Monitored during session, Repositioned, Ice applied    Home Living Family/patient expects to be discharged to:: Private residence Living Arrangements: Spouse/significant other;Children Available Help at Discharge: Family;Available 24 hours/day Type  of Home: House Home Access: Stairs to enter Entrance Stairs-Rails: None Entrance Stairs-Number of Steps: 1 stoop Alternate Level Stairs-Number of Steps: 15 Home Layout: Two level Home Equipment: None      Prior Function Prior Level of Function : Independent/Modified Independent                     Hand Dominance   Dominant Hand: Right    Extremity/Trunk  Assessment   Upper Extremity Assessment Upper Extremity Assessment: Generalized weakness    Lower Extremity Assessment Lower Extremity Assessment: Generalized weakness;RLE deficits/detail RLE Deficits / Details: Weakness due to surgical intervention       Communication   Communication: Prefers language other than English  Cognition Arousal/Alertness: Awake/alert Behavior During Therapy: WFL for tasks assessed/performed Overall Cognitive Status: Within Functional Limits for tasks assessed                                          General Comments      Exercises     Assessment/Plan    PT Assessment Patient needs continued PT services  PT Problem List Decreased strength;Decreased activity tolerance;Decreased balance;Decreased mobility;Decreased knowledge of use of DME;Decreased safety awareness       PT Treatment Interventions DME instruction;Gait training;Stair training;Functional mobility training;Therapeutic activities;Therapeutic exercise;Balance training;Neuromuscular re-education    PT Goals (Current goals can be found in the Care Plan section)  Acute Rehab PT Goals Patient Stated Goal: to go home. PT Goal Formulation: With patient Time For Goal Achievement: 09/26/21 Potential to Achieve Goals: Good    Frequency BID     Co-evaluation               AM-PAC PT "6 Clicks" Mobility  Outcome Measure Help needed turning from your back to your side while in a flat bed without using bedrails?: A Little Help needed moving from lying on your back to sitting on the side of a flat bed without using bedrails?: A Little Help needed moving to and from a bed to a chair (including a wheelchair)?: A Little Help needed standing up from a chair using your arms (e.g., wheelchair or bedside chair)?: A Little Help needed to walk in hospital room?: A Little Help needed climbing 3-5 steps with a railing? : A Lot 6 Click Score: 17    End of Session Equipment  Utilized During Treatment: Gait belt Activity Tolerance: Patient tolerated treatment well Patient left: in bed;with call bell/phone within reach;with bed alarm set;with family/visitor present Nurse Communication: Mobility status PT Visit Diagnosis: Unsteadiness on feet (R26.81);Other abnormalities of gait and mobility (R26.89);Muscle weakness (generalized) (M62.81);Difficulty in walking, not elsewhere classified (R26.2)    Time: [id number] PT Time Calculation (min) (ACUTE ONLY): 41 min   Charges:   PT Evaluation $PT Eval Low Complexity: 1 Low PT Treatments $Gait Training: 23-37 mins $Therapeutic Activity: 8-22 mins        Gwenlyn Saran, PT, DPT 09/12/21, 5:48 PM   Christie Nottingham 09/12/2021, 5:32 PM

## 2021-09-12 NOTE — Transfer of Care (Signed)
Immediate Anesthesia Transfer of Care Note  Patient: Donald Barnett  Procedure(s) Performed: TOTAL HIP ARTHROPLASTY - RNFA (Right: Hip)  Patient Location: PACU  Anesthesia Type:General and Spinal  Level of Consciousness: drowsy and patient cooperative  Airway & Oxygen Therapy: Patient Spontanous Breathing and Patient connected to face mask oxygen  Post-op Assessment: Report given to RN and Post -op Vital signs reviewed and stable  Post vital signs: Reviewed and stable  Last Vitals:  Vitals Value Taken Time  BP 118/80 09/12/21 1018  Temp 36.4 C 09/12/21 1018  Pulse 69 09/12/21 1023  Resp 15 09/12/21 1023  SpO2 100 % 09/12/21 1023    Last Pain:  Vitals:   09/12/21 0645  TempSrc: Oral  PainSc: 9          Complications: No notable events documented.

## 2021-09-12 NOTE — Plan of Care (Signed)
Patient has done well with ambulation, and pain has been tolerable for patient with prn pain management. Advanced diet as well

## 2021-09-12 NOTE — Progress Notes (Signed)
Pre-op questions were asked with the video language line by interpreter number [phone]

## 2021-09-12 NOTE — Op Note (Signed)
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  Post-Op Diagnosis:   Same.  Procedure:   Right total hip arthroplasty.  Surgeon:   Pascal Lux, MD  Assistant:   Waylan Boga, RNFA  Anesthesia:   Spinal  Findings:   As above.  Complications:   None  EBL:   150 cc  Fluids:   800 cc crystalloid  UOP:   None  TT:   None  Drains:   None  Closure:   Staples  Implants:   Biomet press-fit system with a #12 laterally offset Echo femoral stem, a 54 mm acetabular shell with an E-poly hi-wall liner, and a 36 mm ceramic head with a +0 mm neck.  Brief Clinical Note:   The patient is a 61 year old male with a history of progressively worsening right hip pain. His symptoms have progressed despite medications, activity modification, etc. His history and examination are consistent with degenerative joint disease. Preoperative imaging confirmed the presence of degenerative joint disease secondary to advanced avascular necrosis of the femoral head. The patient presents at this time for a right total hip arthroplasty.   Procedure:   The patient was brought into the operating room. After adequate spinal anesthesia was obtained, the patient was repositioned in the left lateral decubitus position and secured using a lateral hip positioner. The right hip and lower extremity were prepped with ChloroPrep solution before being draped sterilely. Preoperative antibiotics were administered. A timeout was performed to verify the appropriate surgical site.    A standard posterior approach to the hip was made through an approximately 4-5 inch incision. The incision was carried down through the subcutaneous tissues to expose the gluteal fascia and proximal end of the iliotibial band. These structures were split the length of the incision and the Charnley self-retaining hip retractor placed. The bursal tissues  were swept posteriorly to expose the short external rotators. The anterior border of the piriformis tendon was identified and this plane developed down through the capsule to enter the joint. A flap of tissue was elevated off the posterior aspect of the femoral neck and greater trochanter and retracted posteriorly. This flap included the piriformis tendon, the short external rotators, and the posterior capsule. The soft tissues were elevated off the lateral aspect of the ilium and a large Steinmann pin placed bicortically.   With the right leg aligned over the left, a drill bit was placed into the greater trochanter parallel to the Steinmann pin and the distance between these two pins measured in order to optimize leg lengths postoperatively. The drill bit was removed and the hip dislocated. The piriformis fossa was debrided of soft tissues before the intramedullary canal was accessed through this point using a triple step reamer. The canal was reamed sequentially beginning with a #7 tapered reamer and progressing to a #12 tapered reamer. This provided excellent circumferential chatter. Using the appropriate guide, a femoral neck cut was made one fingerbreadth above the lesser trochanter. The femoral head was removed.  Attention was directed to the acetabular side. The labrum was debrided circumferentially before the ligamentum teres was removed using a large curette. A line was drawn on the drapes corresponding to the native version of the acetabulum. This line was used as a guide while the acetabulum was reamed sequentially beginning with a 47 mm reamer and progressing to a 53 mm reamer. This provided excellent circumferential chatter. The 53  mm trial acetabulum was positioned and found to fit quite well. Therefore, the 54 mm acetabular shell was selected and impacted into place with care taken to maintain the appropriate version. The trial high wall liner was inserted.  Attention was redirected to the  femoral side. A box osteotome was used to establish version before the canal was broached sequentially beginning with a #7 broach and progressing to a #12 broach. This was left in place and several trial reductions performed using both a standard and laterally offset neck options, as well as the -6 mm and +0 mm neck lengths. After removing the trial components, the "manhole cover" was placed into the apex of the acetabular shell and tightened securely. The permanent E-polyethylene hi-wall liner was impacted into the acetabular shell and its locking mechanism verified using a quarter-inch osteotome. Next, the #12 laterally offset femoral stem was impacted into place with care taken to maintain the appropriate version. A repeat trial reduction was performed using the -3 mm and +0 mm neck lengths. The +0 mm neck length demonstrated excellent stability both in extension and external rotation as well as with flexion to 90 and internal rotation beyond 70. It also was stable in the position of sleep. In addition, leg lengths appeared to be restored appropriately (in his case, lengthening the leg by approximately 7 to 8 mm), both by reassessing the position of the right leg over the left, as well as by measuring the distance between the Steinmann pin and the drill bit. The 36 mm ceramic head with the +0 mm neck was impacted onto the stem of the femoral component. The Morse taper locking mechanism was verified using manual distraction before the head was relocated and placed through a range of motion with the findings as described above.  The wound was copiously irrigated with sterile saline solution via the jet lavage system before the peri-incisional and pericapsular tissues were injected with 30 cc of 0.5% Sensorcaine with epinephrine and 20 cc of Exparel diluted out to 60 cc with normal saline to help with postoperative analgesia. The posterior flap was reapproximated to the posterior aspect of the greater trochanter  using #2 Tycron interrupted sutures placed through drill holes. Several additional #2 Tycron interrupted sutures were used to reinforce this layer of closure. The iliotibial band was reapproximated using #1 Vicryl interrupted sutures before the gluteal fascia was closed using a running #1 Vicryl suture. At this point, 1 g of transexemic acid in 10 cc of normal saline was injected into the joint to help reduce postoperative bleeding. The subcutaneous tissues were closed in several layers using 2-0 Vicryl interrupted sutures before the skin was closed using staples. A sterile occlusive dressing was applied to the wound. The patient was then rolled back into the supine position on his hospital bed before being awakened and returned to the recovery room in satisfactory condition after tolerating the procedure well.

## 2021-09-12 NOTE — Progress Notes (Signed)
Water without ice given to drink per patient request.

## 2021-09-12 NOTE — Anesthesia Preprocedure Evaluation (Addendum)
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  BMI    Body Mass Index: 28.49 kg/m      Reproductive/Obstetrics negative OB ROS                            Anesthesia Physical Anesthesia Plan  ASA: 2  Anesthesia Plan: Spinal   Post-op Pain Management:    Induction: Intravenous  PONV Risk Score and Plan: 1 and TIVA and Propofol infusion  Airway Management Planned: Natural Airway and Simple Face Mask  Additional Equipment:   Intra-op Plan:   Post-operative Plan:   Informed Consent: I have reviewed the patients History and Physical, chart, labs and discussed the procedure including the risks, benefits and alternatives for the proposed anesthesia with the patient or authorized representative who has indicated his/her understanding and acceptance.     Dental Advisory Given  Plan Discussed with: Anesthesiologist, CRNA and Surgeon  Anesthesia Plan Comments:        Anesthesia Quick  Evaluation

## 2021-09-12 NOTE — Progress Notes (Signed)
Notified patient family that he is going to room 144 on the first floor and is doing very well. Interpretive services utilized to communicate with patient for care, and all questions answered to satisfaction.

## 2021-09-12 NOTE — H&P (Signed)
History of Present Illness:  Donald Barnett is a 61 y.o. male that presents to clinic today for his preoperative history and evaluation. Patient presents with his daughter who acts as interpreter. The patient is scheduled to undergo a right total hip arthroplasty on 09/12/21 by Dr. Joice Lofts. His pain began around 8 months ago without trauma or injury. The pain is located in the right hip and groin. He describes his pain as worse with weightbearing and extremes of rotation. He denies associated numbness or tingling. MRI of the hips showed bilateral avascular necrosis of the femoral heads.  The patient's symptoms have progressed to the point that they decrease his quality of life. The patient has previously undergone conservative treatment including NSAIDS and activity modification without adequate control of his symptoms.  Patient denies history of lumbar surgery, history of blood clots, or significant cardiac history. No pertinent drug allergies.  Of note, patient has HIV positive and is treated with Biktarvy.  Past Medical History:   HIV, symptomatic (CMS-HCC)   Syphilis   Past Surgical History:  No pertinent surgical history.  Current Medications:   amLODIPine (NORVASC) 5 MG tablet TAKE 1 TABLET(5 MG) BY MOUTH EVERY DAY 90 tablet 3   bictegravir-emtricitabine-tenofovir alafenamide (BIKTARVY) 50-200-25 mg tablet Take 1 tablet by mouth once daily 90 tablet 3   etodolac (LODINE) 500 MG tablet Take 500 mg by mouth 2 (two) times daily   traMADoL (ULTRAM) 50 mg tablet Take 1 tablet (50 mg total) by mouth every 8 (eight) hours as needed 40 tablet 0   Allergies:   Bactrim [Sulfamethoxazole-Trimethoprim] Rash   Social History:   Socioeconomic History:   Marital status: Married  Spouse name: Geologist, engineering   Number of children: 3   Years of education: 12  Occupational History   Occupation: Unemployed  Comment: Unemployed due to pain  Tobacco Use   Smoking status: Light Smoker  Packs/day: 0.02   Years: 20.00  Pack years: 0.40  Types: Cigarettes  Start date: 2000   Smokeless tobacco: Current  Types: Chew   Tobacco comments:  Pinch of chew 3 x day  Vaping Use   Vaping Use: Never used  Substance and Sexual Activity   Alcohol use: Yes  Comment: weekends   Drug use: Never   Sexual activity: Yes  Partners: Female  Birth control/protection: Rhythm   Social Determinants of Health:   Food Insecurity: No Food Insecurity   Worried About Programme researcher, broadcasting/film/video in the Last Year: Never true   Ran Out of Food in the Last Year: Never true  Transportation Needs: No Transportation Needs   Lack of Transportation (Medical): No   Lack of Transportation (Non-Medical): No  Physical Activity: Insufficiently Active   Days of Exercise per Week: 4 days   Minutes of Exercise per Session: 30 min  Stress: Stress Concern Present   Feeling of Stress : To some extent  Social Connections: Socially Integrated   Frequency of Communication with Friends and Family: Three times a week   Frequency of Social Gatherings with Friends and Family: Three times a week   Attends Religious Services: More than 4 times per year   Active Member of Clubs or Organizations: Yes   Attends Engineer, structural: More than 4 times per year   Marital Status: Married   Family History:   No Known Problems Mother   No Known Problems Father   No Known Problems Son   No Known Problems Other   No Known Problems  Daughter   Review of Systems:  A 10+ ROS was performed, reviewed, and the pertinent orthopaedic findings are documented in the HPI.   Physical Exam:  BP 132/80 (BP Location: Left upper arm, Patient Position: Sitting, BP Cuff Size: Adult)  Ht 172.7 cm (5\' 8" )  Wt 78 kg (172 lb)  BMI 26.15 kg/m   Patient is a well-developed, well-nourished male in no acute distress. Patient has normal mood and affect. Patient is alert and oriented to person, place, and time.   HEENT: Atraumatic, normocephalic. Pupils  equal and reactive to light. Extraocular motion intact. Noninjected sclera.  Cardiovascular: Regular rate and rhythm, with no murmurs, rubs, or gallops. Distal pulses palpable. No carotid bruits.  Respiratory: Lungs clear to auscultation bilaterally.   Right Hip: Pelvic tilt: Negative Limb lengths: Equal with the patient standing Soft tissue swelling: Negative Erythema: Negative Crepitance: Negative Tenderness: Greater trochanter is nontender to palpation. Moderate pain is elicited by axial compression or extremes of rotation. Atrophy: No atrophy. Good hip flexor and abductor strength. Range of Motion: EXT/FLEX: 0/0/90 ADD/ABD: 20/0/20 IR/ER: 0/0/30  Sensation is intact over the saphenous, lateral cutaneous, superficial fibular, and deep fibular nerve distributions.  Tests Performed/Reviewed:  Recent radiographs were reviewed of the right hip and revealed changes consistent with AVN. Deformation of the femoral head and near complete loss of superior femoral acetabular joint space is noted.  Impression:  1. Avascular necrosis of femur head, right.   Plan:   The patient has end-stage degenerative changes of the right hip. It was explained to the patient that the condition is progressive in nature. Having failed conservative treatment, the patient has elected to proceed with a total joint arthroplasty. The patient will undergo a total joint arthroplasty with Dr. 01-07-1997. The risks of surgery, including blood clot and infection, were discussed with the patient. Measures to reduce these risks, including the use of anticoagulation, perioperative antibiotics, and early ambulation were discussed. The importance of postoperative physical therapy was discussed with the patient. The patient elects to proceed with surgery. The patient is instructed to stop all blood thinners prior to surgery. The patient is instructed to call the hospital the day before surgery to learn of the proper arrival time.    C   H&P reviewed and patient re-examined. No changes.

## 2021-09-12 NOTE — Progress Notes (Signed)
Patient has 309 cc in bladder upon bladder scan, and foley catheter ordered, patient is requesting an indwelling foley because he is numb from the spinal. Interpretive services utilized to explain bladder scan and the foley insertion, and rationale for performing both. Patient consents to both and all questions answered to patient satisfaction.

## 2021-09-12 NOTE — Progress Notes (Signed)
Interpretive services utilized for patient care and xray services, patient questions answered to patient satisfaction.

## 2021-09-13 ENCOUNTER — Encounter: Payer: Self-pay | Admitting: Surgery

## 2021-09-13 DIAGNOSIS — M87051 Idiopathic aseptic necrosis of right femur: Secondary | ICD-10-CM | POA: Diagnosis not present

## 2021-09-13 LAB — BASIC METABOLIC PANEL
Anion gap: 6 (ref 5–15)
BUN: 16 mg/dL (ref 6–20)
CO2: 26 mmol/L (ref 22–32)
Calcium: 8.5 mg/dL — ABNORMAL LOW (ref 8.9–10.3)
Chloride: 105 mmol/L (ref 98–111)
Creatinine, Ser: 1.78 mg/dL — ABNORMAL HIGH (ref 0.61–1.24)
GFR, Estimated: 43 mL/min — ABNORMAL LOW (ref >60)
Glucose, Bld: 119 mg/dL — ABNORMAL HIGH (ref 70–99)
Potassium: 4.3 mmol/L (ref 3.5–5.1)
Sodium: 137 mmol/L (ref 135–145)

## 2021-09-13 LAB — CBC
HCT: 31 % — ABNORMAL LOW (ref 39.0–52.0)
Hemoglobin: 10.6 g/dL — ABNORMAL LOW (ref 13.0–17.0)
MCH: 30.3 pg (ref 26.0–34.0)
MCHC: 34.2 g/dL (ref 30.0–36.0)
MCV: 88.6 fL (ref 80.0–100.0)
Platelets: 184 10*3/uL (ref 150–400)
RBC: 3.5 MIL/uL — ABNORMAL LOW (ref 4.22–5.81)
RDW: 12.8 % (ref 11.5–15.5)
WBC: 6.6 10*3/uL (ref 4.0–10.5)
nRBC: 0 % (ref 0.0–0.2)

## 2021-09-13 MED ORDER — ONDANSETRON HCL 4 MG PO TABS: 4.0000 mg | ORAL_TABLET | Freq: Four times a day (QID) | ORAL | 0 refills | Status: DC | PRN

## 2021-09-13 MED ORDER — FE FUMARATE-B12-VIT C-FA-IFC PO CAPS
1.0000 | ORAL_CAPSULE | Freq: Two times a day (BID) | ORAL | Status: DC
  Administered 2021-09-13: 1 via ORAL
  Filled 2021-09-13 (×2): qty 1

## 2021-09-13 MED ORDER — DOCUSATE SODIUM 100 MG PO CAPS: 100.0000 mg | ORAL_CAPSULE | Freq: Two times a day (BID) | ORAL | 0 refills | Status: DC

## 2021-09-13 MED ORDER — FE FUMARATE-B12-VIT C-FA-IFC PO CAPS: 1.0000 | ORAL_CAPSULE | Freq: Two times a day (BID) | ORAL | 0 refills | Status: AC

## 2021-09-13 MED ORDER — TRAMADOL HCL 50 MG PO TABS: 50.0000 mg | ORAL_TABLET | Freq: Four times a day (QID) | ORAL | 0 refills | Status: DC | PRN

## 2021-09-13 MED ORDER — OXYCODONE HCL 5 MG PO TABS: 5.0000 mg | ORAL_TABLET | ORAL | 0 refills | Status: DC | PRN

## 2021-09-13 MED ORDER — APIXABAN 2.5 MG PO TABS: 2.5000 mg | ORAL_TABLET | Freq: Two times a day (BID) | ORAL | 0 refills | Status: DC

## 2021-09-13 MED ORDER — ACETAMINOPHEN 500 MG PO TABS
1000.0000 mg | ORAL_TABLET | Freq: Four times a day (QID) | ORAL | 0 refills | Status: DC
Start: 2021-09-13 — End: 2021-12-05

## 2021-09-13 NOTE — Discharge Summary (Signed)
Physician Discharge Summary  Patient ID: Donald Barnett MRN: [id number] DOB/AGE: [birth year] 61 y.o.  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 male who was admitted 09/12/2021 with a diagnosis of Status post total hip replacement, right and went to the operating room on 09/12/2021 and underwent the above named procedures.    Surgeries: Procedure(s): TOTAL HIP ARTHROPLASTY - RNFA on 09/12/2021 Patient tolerated the surgery well. Taken to PACU where she was stabilized and then transferred to the orthopedic floor.  Started on Eliquis 2.5 mg BID. Foot pumps applied bilaterally at 80 mm. Heels elevated on bed with rolled towels. No evidence of DVT. Negative Homan. Physical therapy started on day #1 for gait training and transfer. OT started day #1 for ADL and assisted devices.  Patient's foley was d/c on day #1. Patient's IV  was d/c on day #1.  On post op day #1 patient was stable and ready for discharge to home with HHPT.  Implants: Biomet press-fit system with a #12 laterally offset Echo femoral stem, a 54 mm acetabular shell with an E-poly hi-wall liner, and a 36 mm ceramic head with a +0 mm neck.    He was given perioperative antibiotics:  Anti-infectives (From admission, onward)    Start     Dose/Rate Route Frequency Ordered Stop   09/12/21 1400  ceFAZolin (ANCEF) IVPB 2g/100 mL premix        2  g 200 mL/hr over 30 Minutes Intravenous Every 6 hours 09/12/21 1159 09/13/21 0215   09/12/21 1300  bictegravir-emtricitabine-tenofovir AF (BIKTARVY) 50-200-25 MG per tablet 1 tablet        1 tablet Oral Daily 09/12/21 1159     09/12/21 0625  ceFAZolin (ANCEF) 2-4 GM/100ML-% IVPB       Note to Pharmacy: Donald Barnett W: cabinet override      09/12/21 0625 09/12/21 0805   09/12/21 0600  ceFAZolin (ANCEF) IVPB 2g/100 mL premix        2 g 200 mL/hr over 30 Minutes Intravenous On call to O.R. 09/11/21 2231 09/12/21 0805     .  He was given sequential compression devices, early ambulation, and Eliquis TEDs for DVT prophylaxis.  He benefited maximally from the hospital stay and there were no complications.    Recent vital signs:  Vitals:   09/12/21 2101 09/13/21 0426  BP: (!) 144/76 128/77  Pulse: 83 76  Resp: 15 15  Temp: 98 F (36.7 C) 98.4 F (36.9 C)  SpO2: 100% 100%    Recent laboratory studies:  Lab Results  Component Value Date   HGB 10.6 (L) 09/13/2021   HGB 14.1 09/10/2021   Lab Results  Component Value Date   WBC 6.6 09/13/2021   PLT 184 09/13/2021   No results found for: "INR" Lab Results  Component Value Date   NA 137 09/13/2021   K 4.3 09/13/2021   CL 105 09/13/2021   CO2  26 09/13/2021   BUN 16 09/13/2021   CREATININE 1.78 (H) 09/13/2021   GLUCOSE 119 (H) 09/13/2021    Discharge Medications:   Allergies as of 09/13/2021       Reactions   Bactrim [sulfamethoxazole-trimethoprim]         Medication List     STOP taking these medications    clotrimazole-betamethasone cream Commonly known as: LOTRISONE   cyanocobalamin 1000 MCG tablet   ergocalciferol 1.25 MG (50000 UT) capsule Commonly known as: VITAMIN D2   etodolac 500 MG tablet Commonly known as: LODINE   ketoconazole 2 % shampoo Commonly known as: NIZORAL       TAKE these medications    acetaminophen 500 MG tablet Commonly known as: TYLENOL Take 2 tablets (1,000 mg total) by  mouth every 6 (six) hours.   amLODipine 5 MG tablet Commonly known as: NORVASC Take 5 mg by mouth daily.   apixaban 2.5 MG Tabs tablet Commonly known as: ELIQUIS Take 1 tablet (2.5 mg total) by mouth 2 (two) times daily.   Biktarvy 50-200-25 MG Tabs tablet Generic drug: bictegravir-emtricitabine-tenofovir AF Take 1 tablet by mouth daily.   docusate sodium 100 MG capsule Commonly known as: COLACE Take 1 capsule (100 mg total) by mouth 2 (two) times daily.   ferrous fumarate-b12-vitamic C-folic acid capsule Commonly known as: TRINSICON / FOLTRIN Take 1 capsule by mouth 2 (two) times daily for 7 days.   ondansetron 4 MG tablet Commonly known as: ZOFRAN Take 1 tablet (4 mg total) by mouth every 6 (six) hours as needed for nausea.   oxyCODONE 5 MG immediate release tablet Commonly known as: Oxy IR/ROXICODONE Take 1-2 tablets (5-10 mg total) by mouth every 4 (four) hours as needed for moderate pain (pain score 4-6).   traMADol 50 MG tablet Commonly known as: ULTRAM Take 1 tablet (50 mg total) by mouth every 6 (six) hours as needed for moderate pain.               Durable Medical Equipment  (From admission, onward)           Start     Ordered   09/12/21 1200  DME Bedside commode  Once       Question:  Patient needs a bedside commode to treat with the following condition  Answer:  Status post total hip replacement, right   09/12/21 1159   09/12/21 1200  DME 3 n 1  Once        09/12/21 1159   09/12/21 1200  DME Walker rolling  Once       Question Answer Comment  Walker: With 5 Inch Wheels   Patient needs a walker to treat with the following condition Status post total hip replacement, right      09/12/21 1159            Diagnostic Studies: DG HIP UNILAT W OR W/O PELVIS 2-3 VIEWS RIGHT  Result Date: 09/12/2021 CLINICAL DATA:  Postop total hip arthroplasty EXAM: DG HIP (WITH OR WITHOUT PELVIS) 2-3V RIGHT COMPARISON:  None Available. FINDINGS: Status post  right total hip arthroplasty. Prosthetic components are in near anatomic alignment. No periprosthetic fracture or lucency. Superficial skin staples. Air within the soft tissues is not unexpected postoperatively. There is no evidence of left hip fracture or dislocation. Sclerotic changes and flattening of the left femoral head, concerning for avascular necrosis. IMPRESSION: 1. Status post right total hip arthroplasty without evidence of acute complication. 2. Sclerotic changes and flattening of  the left femoral head, concerning for avascular necrosis. Electronically Signed   By: Wiliam KeAlison  Vasan M.D.   On: 09/12/2021 11:03    Disposition:      Follow-up Information     Anson OregonMcGhee, James Lance, PA-C Follow up in 2 week(s).   Specialty: Physician Assistant Contact information: [address] [place] [address] [phone]                  Signed: Patience MuscaGAINES, Nesiah Jump CHRISTOPHER 09/13/2021, 7:35 AM

## 2021-09-13 NOTE — Discharge Instructions (Signed)
° °POSTERIOR TOTAL HIP REPLACEMENT POSTOPERATIVE DIRECTIONS ° °Hip Rehabilitation, Guidelines Following Surgery  °The results of a hip operation are greatly improved after range of motion and muscle strengthening exercises. Follow all safety measures which are given to protect your hip. If any of these exercises cause increased pain or swelling in your joint, decrease the amount until you are comfortable again. Then slowly increase the exercises. Call your caregiver if you have problems or questions.  ° °HOME CARE INSTRUCTIONS  °Remove items at home which could result in a fall. This includes throw rugs or furniture in walking pathways.  °· ICE to the affected hip every three hours for 30 minutes at a time and then as needed for pain and swelling.  Continue to use ice on the hip for pain and swelling from surgery. You may notice swelling that will progress down to the foot and ankle.  This is normal after surgery.  Elevate the leg when you are not up walking on it.   °· Continue to use the breathing machine which will help keep your temperature down.  It is common for your temperature to cycle up and down following surgery, especially at night when you are not up moving around and exerting yourself.  The breathing machine keeps your lungs expanded and your temperature down. ° °DIET °You may resume your previous home diet once your are discharged from the hospital. ° °DRESSING / WOUND CARE / SHOWERING °You may start showering once staples have been removed. Change dressing as needed. ° ° ° °ACTIVITY °Walk with your walker as instructed. °Use walker as long as suggested by your caregivers. °Avoid periods of inactivity such as sitting longer than an hour when not asleep. This helps prevent blood clots.  °You may resume a sexual relationship in one month or when given the OK by your doctor.  °You may return to work once you are cleared by your doctor.  °Do not drive a car for 6 weeks or until released by you surgeon.    °Do not drive while taking narcotics. ° °WEIGHT BEARING °Weight bearing as tolerated ° °POSTOPERATIVE CONSTIPATION PROTOCOL °Constipation - defined medically as fewer than three stools per week and severe constipation as less than one stool per week. ° °One of the most common issues patients have following surgery is constipation.  Even if you have a regular bowel pattern at home, your normal regimen is likely to be disrupted due to multiple reasons following surgery.  Combination of anesthesia, postoperative narcotics, change in appetite and fluid intake all can affect your bowels.  In order to avoid complications following surgery, here are some recommendations in order to help you during your recovery period. ° °Colace (docusate) - Pick up an over-the-counter form of Colace or another stool softener and take twice a day as long as you are requiring postoperative pain medications.  Take with a full glass of water daily.  If you experience loose stools or diarrhea, hold the colace until you stool forms back up.  If your symptoms do not get better within 1 week or if they get worse, check with your doctor. ° °Dulcolax (bisacodyl) - Pick up over-the-counter and take as directed by the product packaging as needed to assist with the movement of your bowels.  Take with a full glass of water.  Use this product as needed if not relieved by Colace only.  ° °MiraLax (polyethylene glycol) - Pick up over-the-counter to have on hand.  MiraLax is a   solution that will increase the amount of water in your bowels to assist with bowel movements.  Take as directed and can mix with a glass of water, juice, soda, coffee, or tea.  Take if you go more than two days without a movement. °Do not use MiraLax more than once per day. Call your doctor if you are still constipated or irregular after using this medication for 7 days in a row. ° °If you continue to have problems with postoperative constipation, please contact the office for  further assistance and recommendations.  If you experience "the worst abdominal pain ever" or develop nausea or vomiting, please contact the office immediatly for further recommendations for treatment. ° °ITCHING ° If you experience itching with your medications, try taking only a single pain pill, or even half a pain pill at a time.  You can also use Benadryl over the counter for itching or also to help with sleep.  ° °TED HOSE STOCKINGS °Wear the elastic stockings on both legs for six weeks following surgery during the day but you may remove then at night for sleeping. ° °MEDICATIONS °See your medication summary on the “After Visit Summary” that the nursing staff will review with you prior to discharge.  You may have some home medications which will be placed on hold until you complete the course of blood thinner medication.  It is important for you to complete the blood thinner medication as prescribed by your surgeon.  Continue your approved medications as instructed at time of discharge. ° °PRECAUTIONS °If you experience chest pain or shortness of breath - call 911 immediately for transfer to the hospital emergency department.  °If you develop a fever greater that 101 F, purulent drainage from wound, increased redness or drainage from wound, foul odor from the wound/dressing, or calf pain - CONTACT YOUR SURGEON.   °                                                °FOLLOW-UP APPOINTMENTS °Make sure you keep all of your appointments after your operation with your surgeon and caregivers. You should call the office at the above phone number and make an appointment for approximately two weeks after the date of your surgery or on the date instructed by your surgeon outlined in the "After Visit Summary". ° °RANGE OF MOTION AND STRENGTHENING EXERCISES  °These exercises are designed to help you keep full movement of your hip joint. Follow your caregiver's or physical therapist's instructions. Perform all exercises about  fifteen times, three times per day or as directed. Exercise both hips, even if you have had only one joint replacement. These exercises can be done on a training (exercise) mat, on the floor, on a table or on a bed. Use whatever works the best and is most comfortable for you. Use music or television while you are exercising so that the exercises are a pleasant break in your day. This will make your life better with the exercises acting as a break in routine you can look forward to.  °Lying on your back, slowly slide your foot toward your buttocks, raising your knee up off the floor. Then slowly slide your foot back down until your leg is straight again.  °Lying on your back spread your legs as far apart as you can without causing discomfort.  °Lying on your   side, raise your upper leg and foot straight up from the floor as far as is comfortable. Slowly lower the leg and repeat.  °Lying on your back, tighten up the muscle in the front of your thigh (quadriceps muscles). You can do this by keeping your leg straight and trying to raise your heel off the floor. This helps strengthen the largest muscle supporting your knee.  °Lying on your back, tighten up the muscles of your buttocks both with the legs straight and with the knee bent at a comfortable angle while keeping your heel on the floor.  ° ° ° ° °IF YOU ARE TRANSFERRED TO A SKILLED REHAB FACILITY °If the patient is transferred to a skilled rehab facility following release from the hospital, a list of the current medications will be sent to the facility for the patient to continue.  When discharged from the skilled rehab facility, please have the facility set up the patient's Home Health Physical Therapy prior to being released. Also, the skilled facility will be responsible for providing the patient with their medications at time of release from the facility to include their pain medication, the muscle relaxants, and their blood thinner medication. If the patient  is still at the rehab facility at time of the two week follow up appointment, the skilled rehab facility will also need to assist the patient in arranging follow up appointment in our office and any transportation needs. ° °MAKE SURE YOU:  °Understand these instructions.  °Get help right away if you are not doing well or get worse.  ° ° °Pick up stool softner and laxative for home use following surgery while on pain medications. °Continue to use ice for pain and swelling after surgery. °Do not use any lotions or creams on the incision until instructed by your surgeon. ° °

## 2021-09-13 NOTE — Progress Notes (Addendum)
[facility] waiting on walker and to be seen by OT. Then pt may be d/c  1404 D/c instuctions reviewed with pt, daughter and wife via interpreter Archana [phone]. All questions and concerns answered. IV removed

## 2021-09-13 NOTE — Progress Notes (Signed)
   Subjective: 1 Day Post-Op Procedure(s) (LRB): TOTAL HIP ARTHROPLASTY - RNFA (Right) Patient reports pain as moderate.   Patient is well, and has had no acute complaints or problems Denies any CP, SOB, ABD pain. We will continue therapy today.  Plan is to go Home after hospital stay.  Objective: Vital signs in last 24 hours: Temp:  [97.5 F (36.4 C)-98.4 F (36.9 C)] 98.4 F (36.9 C) (06/09 0426) Pulse Rate:  [66-83] 76 (06/09 0426) Resp:  [12-20] 15 (06/09 0426) BP: (109-149)/(60-82) 128/77 (06/09 0426) SpO2:  [98 %-100 %] 100 % (06/09 0426) Weight:  [79 kg] 79 kg (06/08 1203)  Intake/Output from previous day: 06/08 0701 - 06/09 0700 In: 1580 [P.O.:480; I.V.:900; IV Piggyback:200] Out: 950 [Urine:800; Blood:150] Intake/Output this shift: No intake/output data recorded.  Recent Labs    09/10/21 0924 09/13/21 0500  HGB 14.1 10.6*   Recent Labs    09/10/21 0924 09/13/21 0500  WBC 6.0 6.6  RBC 4.67 3.50*  HCT 41.3 31.0*  PLT 263 184   Recent Labs    09/10/21 0924 09/13/21 0500  NA 141 137  K 4.0 4.3  CL 105 105  CO2 26 26  BUN 17 16  CREATININE 1.65* 1.78*  GLUCOSE 93 119*  CALCIUM 9.6 8.5*   No results for input(s): "LABPT", "INR" in the last 72 hours.  EXAM General - Patient is Alert, Appropriate, and Oriented Extremity - Neurovascular intact Sensation intact distally Intact pulses distally Dorsiflexion/Plantar flexion intact Dressing - dressing C/D/I and no drainage Motor Function - intact, moving foot and toes well on exam.   Past Medical History:  Diagnosis Date   HIV (human immunodeficiency virus infection) (HCC)    Syphilis     Assessment/Plan:   1 Day Post-Op Procedure(s) (LRB): TOTAL HIP ARTHROPLASTY - RNFA (Right) Principal Problem:   Status post total hip replacement, right  Estimated body mass index is 28.98 kg/m as calculated from the following:   Height as of this encounter: 5\' 5"  (1.651 m).   Weight as of this encounter:  79 kg. Advance diet Up with therapy Acute post op blood loss anemia - Hgb 10.6, start iron supplement Pain controlled VSS CM to assist with discharge to home with HHPT today pending completion of PT goals  DVT Prophylaxis - TED hose and SCDS Eliquis Weight-Bearing as tolerated to right leg   T. , PA-C [facility] 09/13/2021, 7:30 AM

## 2021-09-13 NOTE — Progress Notes (Signed)
Physical Therapy Treatment Patient Details Name: Donald Barnett MRN: [id number] DOB: [birth year] Today's Date: 09/13/2021   History of Present Illness Pt is 62 y.o. male s/p R THA on 09/10/21.  Pt has PMH including HIV, Syphilis, and Renal Insufficiency.    PT Comments    Pt received upright in bed with daughter present at bedside to translate. Pt mod-I with bed mobility and STS transfer to RW tolerating gait bouts of ~140' total. Pt remains with antalgic gait on RLE during stance phase with step to pattern with difficulty progressing to step through but maintains steadiness with supervision. Pt performing single step with RW with PT demo with min VC's for keeping close to step with descending step. Pt able to perform safely and progress to completing 16 stairs total step to pattern sideways with R rail. Pt safe completing with supervision and intermittent need for standing rest breaks throughout. Pt returning to room completing HEP packet and education on car transfer. Pt and daughter had questions on lower body dressing and how to safely perform maintaining post hip precautions. Pt and daughter educated on need for OT eval but no orders placed. PT placed OT orders and OT team made aware. Pt would benefit from OT eval and education prior to d/c but from PT perspective, pt safe to d/c home with [facility] PT.    Recommendations for follow up therapy are one component of a multi-disciplinary discharge planning process, led by the attending physician.  Recommendations may be updated based on patient status, additional functional criteria and insurance authorization.  Follow Up Recommendations  Home health PT     Assistance Recommended at Discharge Intermittent Supervision/Assistance  Patient can return home with the following A little help with walking and/or transfers;A little help with bathing/dressing/bathroom   Equipment Recommendations  Rolling walker (2 wheels);BSC/3in1    Recommendations for  Other Services       Precautions / Restrictions Precautions Precautions: Posterior Hip Precaution Booklet Issued: Yes (comment) Precaution Comments: R THA Restrictions Weight Bearing Restrictions: Yes RLE Weight Bearing: Weight bearing as tolerated LLE Weight Bearing: Weight bearing as tolerated     Mobility  Bed Mobility Overal bed mobility: Modified Independent             General bed mobility comments: utilizes handrails and increased time. Patient Response: Cooperative  Transfers Overall transfer level: Modified independent Equipment used: Rolling walker (2 wheels)               General transfer comment: Pt able to stand with good technique and has good balance.    Ambulation/Gait Ambulation/Gait assistance: Supervision Gait Distance (Feet): 140 Feet Assistive device: Rolling walker (2 wheels) Gait Pattern/deviations: Step-through pattern, Decreased step length - left, Decreased stance time - right, Decreased stride length       General Gait Details: Pt with slow but steady cadence throughout ambulation.   Stairs Stairs: Yes Stairs assistance: Supervision Stair Management: One rail Right, Sideways Number of Stairs: 16 (4 steps x4 bouts) General stair comments: Also trialed single step to enter home with RW. Pt demonstrating safe completionwith supervision following correct sequencing of LE's throughout with no balance deficits evident.   Wheelchair Mobility    Modified Rankin (Stroke Patients Only)       Balance  Cognition Arousal/Alertness: Awake/alert Behavior During Therapy: WFL for tasks assessed/performed Overall Cognitive Status: Within Functional Limits for tasks assessed                                          Exercises Total Joint Exercises Ankle Circles/Pumps: AROM, Strengthening, Both, 10 reps, Seated Quad Sets: AROM, Strengthening, Right, 10  reps, Supine Gluteal Sets: AROM, Supine, Strengthening, Both, 10 reps Towel Squeeze: AROM, Strengthening, Supine, Both, 10 reps Short Arc Quad: AROM, Strengthening, Right, 10 reps, Supine Heel Slides: AROM, Strengthening, Right, 10 reps, Supine Hip ABduction/ADduction: AROM, Strengthening, Right, Supine, 10 reps Straight Leg Raises: AROM, Strengthening, Right, 10 reps, Supine Long Arc Quad: AROM, Supine, Strengthening, Right, 10 reps Other Exercises Other Exercises: Role of PT in acute setting, education on how to adequately maintain precautions. Need for OT eval for lower body grooming prior to discharge. Review of HEP    General Comments        Pertinent Vitals/Pain Pain Assessment Pain Assessment: 0-10 Pain Score: 3  Pain Location: Hip Pain Descriptors / Indicators: Aching Pain Intervention(s): Limited activity within patient's tolerance, Monitored during session, Repositioned    Home Living                          Prior Function            PT Goals (current goals can now be found in the care plan section) Acute Rehab PT Goals Patient Stated Goal: to go home. PT Goal Formulation: With patient Time For Goal Achievement: 09/26/21 Potential to Achieve Goals: Good Progress towards PT goals: Progressing toward goals    Frequency    BID      PT Plan Current plan remains appropriate    Co-evaluation              AM-PAC PT "6 Clicks" Mobility   Outcome Measure  Help needed turning from your back to your side while in a flat bed without using bedrails?: A Little Help needed moving from lying on your back to sitting on the side of a flat bed without using bedrails?: A Little Help needed moving to and from a bed to a chair (including a wheelchair)?: A Little Help needed standing up from a chair using your arms (e.g., wheelchair or bedside chair)?: A Little Help needed to walk in hospital room?: A Little Help needed climbing 3-5 steps with a railing?  : A Little 6 Click Score: 18    End of Session Equipment Utilized During Treatment: Gait belt Activity Tolerance: Patient tolerated treatment well Patient left: in bed;with call bell/phone within reach;with bed alarm set;with family/visitor present Nurse Communication: Mobility status PT Visit Diagnosis: Unsteadiness on feet (R26.81);Other abnormalities of gait and mobility (R26.89);Muscle weakness (generalized) (M62.81);Difficulty in walking, not elsewhere classified (R26.2)     Time: 8546-2703 PT Time Calculation (min) (ACUTE ONLY): 50 min  Charges:  $Gait Training: 23-37 mins $Therapeutic Exercise: 8-22 mins                    Delshawn Stech M. Fairly IV, PT, DPT Physical Therapist- [place]  [facility]  09/13/2021, 11:56 AM

## 2021-09-13 NOTE — TOC Initial Note (Addendum)
Transition of Care [facility]) - Initial/Assessment Note    Patient Details  Name: Donald Barnett MRN: [id number] Date of Birth: May 15, 1960  [facility]) CM/SW Contact:    Donald Cline, LCSW Phone Number: 09/13/2021, 10:54 AM  Clinical Narrative:                CSW spoke to patient's daughter Donald Barnett. Patient was prearranged by Surgeon's Office with [facility].  Donald Barnett stated patient lives with her and already has a 3in1. She is agreeable to a rolling walker being ordered. She would like to know if there are any copays. Referral made to [facility] with [facility] and asked her to talk with daughter about copays.     Expected Discharge Plan: Home w Home Health Services Barriers to Discharge: Continued Medical Work up   Patient Goals and CMS Choice Patient states their goals for this hospitalization and ongoing recovery are:: home with home health CMS Medicare.gov Compare Post Acute Care list provided to:: Patient Represenative (must comment) Choice offered to / list presented to : Adult Children  Expected Discharge Plan and Services Expected Discharge Plan: Home w Home Health Services       Living arrangements for the past 2 months: Single Family Home                 DME Arranged: Walker rolling DME Agency: [employer] Date DME Agency Contacted: 09/13/21   Representative spoke with at DME Agency: Donald Barnett HH Arranged: PT       Prior Living Arrangements/Services Living arrangements for the past 2 months: Single Family Home Lives with:: Adult Children Patient language and need for interpreter reviewed:: Yes Do you feel safe going back to the place where you live?: Yes      Need for Family Participation in Patient Care: Yes (Comment) Care giver support system in place?: Yes (comment) Current home services: DME Criminal Activity/Legal Involvement Pertinent to Current Situation/Hospitalization: No - Comment as needed  Activities of Daily Living Home  Assistive Devices/Equipment: None ADL Screening (condition at time of admission) Patient's cognitive ability adequate to safely complete daily activities?: Yes Is the patient deaf or have difficulty hearing?: No Does the patient have difficulty seeing, even when wearing glasses/contacts?: No Does the patient have difficulty concentrating, remembering, or making decisions?: No Patient able to express need for assistance with ADLs?: Yes Does the patient have difficulty dressing or bathing?: No Independently performs ADLs?: Yes (appropriate for developmental age) Does the patient have difficulty walking or climbing stairs?: No Weakness of Legs: Right Weakness of Arms/Hands: None  Permission Sought/Granted Permission sought to share information with : Facility Industrial/product designer granted to share information with : Yes, Verbal Permission Granted (by daughter)     Permission granted to share info w AGENCY: HH, DME agencies        Emotional Assessment         Alcohol / Substance Use: Not Applicable Psych Involvement: No (comment)  Admission diagnosis:  Status post total hip replacement, right [E08.144] Patient Active Problem List   Diagnosis Date Noted   Status post total hip replacement, right 09/12/2021   HIV, symptomatic (HCC) 12/27/2019   Syphilis 12/27/2019   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 [place] [address] Phone: [phone] Fax: [phone]  Social Determinants of Health (SDOH) Interventions    Readmission Risk Interventions     No data to display           

## 2021-09-13 NOTE — Anesthesia Postprocedure Evaluation (Signed)
Anesthesia Post Note  Patient: Donald Barnett  Procedure(s) Performed: TOTAL HIP ARTHROPLASTY - RNFA (Right: Hip)  Patient location during evaluation: Nursing Unit Anesthesia Type: Spinal Level of consciousness: awake and alert and oriented Pain management: pain level controlled Vital Signs Assessment: post-procedure vital signs reviewed and stable Respiratory status: respiratory function stable Cardiovascular status: stable Postop Assessment: no headache, no backache, patient able to bend at knees, no apparent nausea or vomiting, able to ambulate and adequate PO intake Anesthetic complications: no   No notable events documented.   Last Vitals:  Vitals:   09/13/21 0426 09/13/21 0749  BP: 128/77 (!) 150/79  Pulse: 76 83  Resp: 15 16  Temp: 36.9 C 36.9 C  SpO2: 100% 100%    Last Pain:  Vitals:   09/13/21 0819  TempSrc:   PainSc: 0-No pain                 Lanora Manis

## 2021-09-13 NOTE — Evaluation (Signed)
Occupational Therapy Evaluation Patient Details Name: Donald Barnett MRN: [id number] DOB: 1961/02/25 Today's Date: 09/13/2021   History of Present Illness Pt is 61 y.o. male s/p R THA on 09/10/21.  Pt has PMH including HIV, Syphilis, and Renal Insufficiency.   Clinical Impression   Upon entering the room, pt seated on EOB with family present in the room and agreeable to OT intervention. OT reviewing posterior hip precautions with pt related to self care tasks. Focus on LB dressing techniques. OT educated and demonstrates use of LH reacher to thread clothing onto R LE and pt able to return demonstration. Pt standing with close supervision and needing min guard for balance to pull over B hips. Pt reports wife will assist with socks and shoes. Discussion of bathroom set up and notified he would be taking a sink/basin bath for the next 2 weeks until cleared by physican to shower to decrease chance of infection. Pt reports having handicap height toilet and no concerns about hip precautions for toilet transfer at home. Pt needs RW for mobility and recommended purchase of LH reacher to increase independence. Pt is fully dressed after session and has no further concerns. OT to SIGN OFF.      Recommendations for follow up therapy are one component of a multi-disciplinary discharge planning process, led by the attending physician.  Recommendations may be updated based on patient status, additional functional criteria and insurance authorization.   Follow Up Recommendations  No OT follow up    Assistance Recommended at Discharge Intermittent Supervision/Assistance  Patient can return home with the following A little help with bathing/dressing/bathroom;Help with stairs or ramp for entrance;Assist for transportation;Assistance with cooking/housework       Equipment Recommendations  Other (comment) (RW)       Precautions / Restrictions Precautions Precautions: Posterior Hip Precaution Booklet Issued:  Yes (comment) Precaution Comments: R THA Restrictions Weight Bearing Restrictions: Yes RLE Weight Bearing: Weight bearing as tolerated      Mobility Bed Mobility               General bed mobility comments: Pt seated on EOB at beginning and end of session    Transfers Overall transfer level: Modified independent Equipment used: Rolling walker (2 wheels)                      Balance Overall balance assessment: Modified Independent                                         ADL either performed or assessed with clinical judgement   ADL Overall ADL's : Needs assistance/impaired                     Lower Body Dressing: Minimal assistance;Sit to/from stand;With adaptive equipment Lower Body Dressing Details (indicate cue type and reason): with use of LH reacher to thread onto feet Toilet Transfer: Supervision/safety;Ambulation;Rolling walker (2 wheels) Toilet Transfer Details (indicate cue type and reason): simulated                 Vision Patient Visual Report: No change from baseline              Pertinent Vitals/Pain Pain Assessment Pain Assessment: Faces Faces Pain Scale: Hurts a little bit Pain Location: Hip Pain Descriptors / Indicators: Aching, Discomfort Pain Intervention(s): Monitored during session, Premedicated before session, Repositioned  Hand Dominance Right   Extremity/Trunk Assessment Upper Extremity Assessment Upper Extremity Assessment: Generalized weakness   Lower Extremity Assessment Lower Extremity Assessment: Generalized weakness;RLE deficits/detail RLE Deficits / Details: Weakness due to surgical intervention       Communication Communication Communication: Prefers language other than [place];Other (comment) (family in room prefers to interpret during session)   Cognition Arousal/Alertness: Awake/alert Behavior During Therapy: WFL for tasks assessed/performed Overall Cognitive Status:  Within Functional Limits for tasks assessed                                                  Home Living Family/patient expects to be discharged to:: Private residence Living Arrangements: Spouse/significant other;Children Available Help at Discharge: Family;Available 24 hours/day Type of Home: House Home Access: Stairs to enter [employer] of Steps: 1 stoop Entrance Stairs-Rails: None Home Layout: Two level Alternate Level Stairs-Number of Steps: 15 Alternate Level Stairs-Rails: Right Bathroom Shower/Tub: Teacher, early years/pre: Handicapped height     Home Equipment: None          Prior Functioning/Environment Prior Level of Function : Independent/Modified Independent                                OT Goals(Current goals can be found in the care plan section) Acute Rehab OT Goals Patient Stated Goal: to go home OT Goal Formulation: With patient Time For Goal Achievement: 09/13/21 Potential to Achieve Goals: Good  OT Frequency:         AM-PAC OT "6 Clicks" Daily Activity     Outcome Measure Help from another person eating meals?: None Help from another person taking care of personal grooming?: None Help from another person toileting, which includes using toliet, bedpan, or urinal?: None Help from another person bathing (including washing, rinsing, drying)?: A Little Help from another person to put on and taking off regular upper body clothing?: None Help from another person to put on and taking off regular lower body clothing?: A Little 6 Click Score: 22   End of Session Equipment Utilized During Treatment: Rolling walker (2 wheels);Other (comment) Management consultant) Nurse Communication: Mobility status  Activity Tolerance: Patient tolerated treatment well Patient left: in bed;with call bell/phone within reach;with family/visitor present                   Time: [id number] OT Time Calculation (min): 16 min Charges:   OT General Charges $OT Visit: 1 Visit OT Evaluation $OT Eval Low Complexity: 1 Low OT Treatments $Self Care/Home Management : 8-22 mins   Darleen Crocker, MS, OTR/L , CBIS ascom [phone]  09/13/21, 2:11 PM

## 2021-09-16 LAB — SURGICAL PATHOLOGY

## 2021-11-26 ENCOUNTER — Other Ambulatory Visit: Payer: Self-pay | Admitting: Surgery

## 2021-12-04 NOTE — Patient Instructions (Addendum)
Your procedure is scheduled on:12-17-21 Tuesday Report to the Registration Desk on the 1st floor of the [facility].Then proceed to the 2nd floor Surgery Desk To find out your arrival time, please call [phone] between 1PM - 3PM on:12-16-21 Monday If your arrival time is 6:00 am, do not arrive prior to that time as the [facility] entrance doors do not open until 6:00 am.  REMEMBER: Instructions that are not followed completely may result in serious medical risk, up to and including death; or upon the discretion of your surgeon and anesthesiologist your surgery may need to be rescheduled.  Do not eat food after midnight the night before surgery.  No gum chewing, lozengers or hard candies.  You may however, drink CLEAR liquids up to 2 hours before you are scheduled to arrive for your surgery. Do not drink anything within 2 hours of your scheduled arrival time.  Clear liquids include: - water  - apple juice without pulp - gatorade (not RED colors) - black coffee or tea (Do NOT add milk or creamers to the coffee or tea) Do NOT drink anything that is not on this list.  In addition, your doctor has ordered for you to drink the provided  Ensure Pre-Surgery Clear Carbohydrate Drink  Drinking this carbohydrate drink up to two hours before surgery helps to reduce insulin resistance and improve patient outcomes. Please complete drinking 2 hours prior to scheduled arrival time.  TAKE THESE MEDICATIONS THE MORNING OF SURGERY WITH A SIP OF WATER: -amLODipine (NORVASC) -bictegravir-emtricitabine-tenofovir AF (BIKTARVY)  One week prior to surgery:Last dose on 12-09-21 Monday Stop Anti-inflammatories (NSAIDS) such as Advil, Aleve, Ibuprofen, Motrin, Naproxen, Naprosyn and Aspirin based products such as Excedrin, Goodys Powder, BC Powder.You may however, take Tylenol if needed for pain up until the day of surgery.  Stop ANY OVER THE COUNTER supplements/vitamins 7 days prior to surgery  No Alcohol  for 24 hours before or after surgery.  No Smoking including e-cigarettes for 24 hours prior to surgery.  No chewable tobacco products for at least 6 hours prior to surgery.  No nicotine patches on the day of surgery.  Do not use any "recreational" drugs for at least a week prior to your surgery.  Please be advised that the combination of cocaine and anesthesia may have negative outcomes, up to and including death. If you test positive for cocaine, your surgery will be cancelled.  On the morning of surgery brush your teeth with toothpaste and water, you may rinse your mouth with mouthwash if you wish. Do not swallow any toothpaste or mouthwash.  Use CHG Soap as directed on instruction sheet.  Do not wear jewelry, make-up, hairpins, clips or nail polish.  Do not wear lotions, powders, or perfumes.   Do not shave body from the neck down 48 hours prior to surgery just in case you cut yourself which could leave a site for infection.  Also, freshly shaved skin may become irritated if using the CHG soap.  Contact lenses, hearing aids and dentures may not be worn into surgery.  Do not bring valuables to the hospital. [facility] is not responsible for any missing/lost belongings or valuables.   Notify your doctor if there is any change in your medical condition (cold, fever, infection).  Wear comfortable clothing (specific to your surgery type) to the hospital.  After surgery, you can help prevent lung complications by doing breathing exercises.  Take deep breaths and cough every 1-2 hours. Your doctor may order a  device called an Incentive Spirometer to help you take deep breaths. When coughing or sneezing, hold a pillow firmly against your incision with both hands. This is called "splinting." Doing this helps protect your incision. It also decreases belly discomfort.  If you are being admitted to the hospital overnight, leave your suitcase in the car. After surgery it may be brought to  your room.  If you are being discharged the day of surgery, you will not be allowed to drive home. You will need a responsible adult (18 years or older) to drive you home and stay with you that night.   If you are taking public transportation, you will need to have a responsible adult (18 years or older) with you. Please confirm with your physician that it is acceptable to use public transportation.   Please call the [place] [facility] at [phone] if you have any questions about these instructions.  Surgery Visitation Policy:  Patients undergoing a surgery or procedure may have two family members or support persons with them as long as the person is not COVID-19 positive or experiencing its symptoms.   Inpatient Visitation:    Visiting hours are 7 a.m. to 8 p.m. Up to four visitors are allowed at one time in a patient room, including children. The visitors may rotate out with other people during the day. One designated support person (adult) may remain overnight.    How to Use Chlorhexidine Before Surgery  Chlorhexidine gluconate (CHG) is a germ-killing (antiseptic) solution that is used to clean the skin. It can get rid of the bacteria that normally live on the skin and can keep them away for about 24 hours. To clean your skin with CHG, you may be given: A CHG solution to use in the shower or as part of a sponge bath. How to use CHG solution Use CHG only as told by your health care provider, and follow the instructions on the label. Use the full amount of CHG as directed. Usually, this is one bottle. During a shower Follow these steps when using CHG solution during a shower (unless your health care provider gives you different instructions): Start the shower. Use your normal soap and shampoo to wash your face and hair. Turn off the shower or move out of the shower stream. Pour the CHG onto a clean washcloth. Do not use any type of brush or rough-edged  sponge. Starting at your neck, lather your body down to your toes. Make sure you follow these instructions: If you will be having surgery, pay special attention to the part of your body where you will be having surgery. Scrub this area for at least 1 minute. Do not use CHG on your head or face. If the solution gets into your ears or eyes, rinse them well with water. Avoid your genital area. Avoid any areas of skin that have broken skin, cuts, or scrapes. Scrub your back and under your arms. Make sure to wash skin folds. Let the lather sit on your skin for 1-2 minutes or as long as told by your health care provider. Thoroughly rinse your entire body in the shower. Make sure that all body creases and crevices are rinsed well. Dry off with a clean towel. Do not put any substances on your body afterward--such as powder, lotion, or perfume--unless you are told to do so by your health care provider. Only use lotions that are recommended by the manufacturer. Put on clean clothes or pajamas. 10.If it is  the night before your surgery, sleep in clean sheets.   How to Use an Incentive Spirometer An incentive spirometer is a tool that measures how well you are filling your lungs with each breath. Learning to take long, deep breaths using this tool can help you keep your lungs clear and active. This may help to reverse or lessen your chance of developing breathing (pulmonary) problems, especially infection. You may be asked to use a spirometer: After a surgery. If you have a lung problem or a history of smoking. After a long period of time when you have been unable to move or be active. If the spirometer includes an indicator to show the highest number that you have reached, your health care provider or respiratory therapist will help you set a goal. Keep a log of your progress as told by your health care provider. What are the risks? Breathing too quickly may cause dizziness or cause you to pass out. Take  your time so you do not get dizzy or light-headed. If you are in pain, you may need to take pain medicine before doing incentive spirometry. It is harder to take a deep breath if you are having pain. How to use your incentive spirometer  Sit up on the edge of your bed or on a chair. Hold the incentive spirometer so that it is in an upright position. Before you use the spirometer, breathe out normally. Place the mouthpiece in your mouth. Make sure your lips are closed tightly around it. Breathe in slowly and as deeply as you can through your mouth, causing the piston or the ball to rise toward the top of the chamber. Hold your breath for 3-5 seconds, or for as long as possible. If the spirometer includes a coach indicator, use this to guide you in breathing. Slow down your breathing if the indicator goes above the marked areas. Remove the mouthpiece from your mouth and breathe out normally. The piston or ball will return to the bottom of the chamber. Rest for a few seconds, then repeat the steps 10 or more times. Take your time and take a few normal breaths between deep breaths so that you do not get dizzy or light-headed. Do this every 1-2 hours when you are awake. If the spirometer includes a goal marker to show the highest number you have reached (best effort), use this as a goal to work toward during each repetition. After each set of 10 deep breaths, cough a few times. This will help to make sure that your lungs are clear. If you have an incision on your chest or abdomen from surgery, place a pillow or a rolled-up towel firmly against the incision when you cough. This can help to reduce pain while taking deep breaths and coughing. General tips When you are able to get out of bed: Walk around often. Continue to take deep breaths and cough in order to clear your lungs. Keep using the incentive spirometer until your health care provider says it is okay to stop using it. If you have been in the  hospital, you may be told to keep using the spirometer at home. Contact a health care provider if: You are having difficulty using the spirometer. You have trouble using the spirometer as often as instructed. Your pain medicine is not giving enough relief for you to use the spirometer as told. You have a fever. Get help right away if: You develop shortness of breath. You develop a cough with bloody mucus  from the lungs. You have fluid or blood coming from an incision site after you cough. Summary An incentive spirometer is a tool that can help you learn to take long, deep breaths to keep your lungs clear and active. You may be asked to use a spirometer after a surgery, if you have a lung problem or a history of smoking, or if you have been inactive for a long period of time. Use your incentive spirometer as instructed every 1-2 hours while you are awake. If you have an incision on your chest or abdomen, place a pillow or a rolled-up towel firmly against your incision when you cough. This will help to reduce pain. Get help right away if you have shortness of breath, you cough up bloody mucus, or blood comes from your incision when you cough. This information is not intended to replace advice given to you by your health care provider. Make sure you discuss any questions you have with your health care provider. Document Revised: 06/13/2019 Document Reviewed: 06/13/2019 Elsevier Patient Education  [place].

## 2021-12-05 ENCOUNTER — Encounter
Admission: RE | Admit: 2021-12-05 | Discharge: 2021-12-05 | Disposition: A | Discharge status: Home / Self Care | Payer: Commercial Managed Care - HMO | Source: Ambulatory Visit | Attending: Surgery | Admitting: Surgery

## 2021-12-05 VITALS — BP 136/70 | HR 81 | Resp 14 | Ht 65.0 in | Wt 170.0 lb

## 2021-12-05 DIAGNOSIS — Z01818 Encounter for other preprocedural examination: Secondary | ICD-10-CM | POA: Diagnosis not present

## 2021-12-05 HISTORY — DX: Pneumonia, unspecified organism: J18.9

## 2021-12-05 HISTORY — DX: Hyperglycemia, unspecified: R73.9

## 2021-12-05 LAB — URINALYSIS, ROUTINE W REFLEX MICROSCOPIC
Bilirubin Urine: NEGATIVE
Glucose, UA: NEGATIVE mg/dL
Ketones, ur: NEGATIVE mg/dL
Leukocytes,Ua: NEGATIVE
Nitrite: NEGATIVE
Protein, ur: 100 mg/dL — AB
Specific Gravity, Urine: 1.025 (ref 1.005–1.030)
pH: 5 (ref 5.0–8.0)

## 2021-12-05 LAB — CBC WITH DIFFERENTIAL/PLATELET
Abs Immature Granulocytes: 0.02 10*3/uL (ref 0.00–0.07)
Basophils Absolute: 0 10*3/uL (ref 0.0–0.1)
Basophils Relative: 1 %
Eosinophils Absolute: 0.1 10*3/uL (ref 0.0–0.5)
Eosinophils Relative: 1 %
HCT: 40.8 % (ref 39.0–52.0)
Hemoglobin: 14 g/dL (ref 13.0–17.0)
Immature Granulocytes: 0 %
Lymphocytes Relative: 28 %
Lymphs Abs: 1.6 10*3/uL (ref 0.7–4.0)
MCH: 29.7 pg (ref 26.0–34.0)
MCHC: 34.3 g/dL (ref 30.0–36.0)
MCV: 86.6 fL (ref 80.0–100.0)
Monocytes Absolute: 0.4 10*3/uL (ref 0.1–1.0)
Monocytes Relative: 6 %
Neutro Abs: 3.8 10*3/uL (ref 1.7–7.7)
Neutrophils Relative %: 64 %
Platelets: 306 10*3/uL (ref 150–400)
RBC: 4.71 MIL/uL (ref 4.22–5.81)
RDW: 12.9 % (ref 11.5–15.5)
WBC: 5.9 10*3/uL (ref 4.0–10.5)
nRBC: 0 % (ref 0.0–0.2)

## 2021-12-05 LAB — COMPREHENSIVE METABOLIC PANEL
ALT: 14 U/L (ref 0–44)
AST: 17 U/L (ref 15–41)
Albumin: 3.9 g/dL (ref 3.5–5.0)
Alkaline Phosphatase: 113 U/L (ref 38–126)
Anion gap: 11 (ref 5–15)
BUN: 16 mg/dL (ref 6–20)
CO2: 24 mmol/L (ref 22–32)
Calcium: 9.3 mg/dL (ref 8.9–10.3)
Chloride: 106 mmol/L (ref 98–111)
Creatinine, Ser: 1.26 mg/dL — ABNORMAL HIGH (ref 0.61–1.24)
GFR, Estimated: >60 mL/min (ref >60)
Glucose, Bld: 188 mg/dL — ABNORMAL HIGH (ref 70–99)
Potassium: 3.6 mmol/L (ref 3.5–5.1)
Sodium: 141 mmol/L (ref 135–145)
Total Bilirubin: 0.7 mg/dL (ref 0.3–1.2)
Total Protein: 8.1 g/dL (ref 6.5–8.1)

## 2021-12-05 LAB — TYPE AND SCREEN
ABO/RH(D): O POS
Antibody Screen: NEGATIVE

## 2021-12-05 LAB — SURGICAL PCR SCREEN
MRSA, PCR: NEGATIVE
Staphylococcus aureus: NEGATIVE

## 2021-12-16 MED ORDER — CHLORHEXIDINE GLUCONATE 0.12 % MT SOLN: 15.0000 mL | Freq: Once | OROMUCOSAL | Status: AC

## 2021-12-16 MED ORDER — LACTATED RINGERS IV SOLN: INTRAVENOUS | Status: DC

## 2021-12-16 MED ORDER — CEFAZOLIN SODIUM-DEXTROSE 2-4 GM/100ML-% IV SOLN
2.0000 g | INTRAVENOUS | Status: AC
  Administered 2021-12-17: 2 g via INTRAVENOUS

## 2021-12-16 MED ORDER — ORAL CARE MOUTH RINSE: 15.0000 mL | Freq: Once | OROMUCOSAL | Status: AC

## 2021-12-16 MED ORDER — FAMOTIDINE 20 MG PO TABS: 20.0000 mg | ORAL_TABLET | Freq: Once | ORAL | Status: AC

## 2021-12-17 ENCOUNTER — Other Ambulatory Visit: Payer: Self-pay

## 2021-12-17 ENCOUNTER — Encounter
Admission: RE | Disposition: A | Discharge status: Home / Self Care | Payer: Self-pay | Source: Home / Self Care | Attending: Surgery

## 2021-12-17 ENCOUNTER — Ambulatory Visit: Payer: Commercial Managed Care - HMO | Admitting: Urgent Care

## 2021-12-17 ENCOUNTER — Ambulatory Visit: Payer: Commercial Managed Care - HMO

## 2021-12-17 ENCOUNTER — Ambulatory Visit
Admission: RE | Admit: 2021-12-17 | Discharge: 2021-12-17 | Disposition: A | Discharge status: Home / Self Care | Payer: Commercial Managed Care - HMO | Attending: Surgery | Admitting: Surgery

## 2021-12-17 ENCOUNTER — Encounter: Payer: Self-pay | Admitting: Surgery

## 2021-12-17 DIAGNOSIS — M1612 Unilateral primary osteoarthritis, left hip: Secondary | ICD-10-CM | POA: Diagnosis present

## 2021-12-17 DIAGNOSIS — M87852 Other osteonecrosis, left femur: Secondary | ICD-10-CM | POA: Diagnosis not present

## 2021-12-17 DIAGNOSIS — Z96641 Presence of right artificial hip joint: Secondary | ICD-10-CM | POA: Diagnosis not present

## 2021-12-17 DIAGNOSIS — M89452 Other hypertrophic osteoarthropathy, left thigh: Secondary | ICD-10-CM | POA: Diagnosis not present

## 2021-12-17 DIAGNOSIS — Z21 Asymptomatic human immunodeficiency virus [HIV] infection status: Secondary | ICD-10-CM | POA: Diagnosis not present

## 2021-12-17 DIAGNOSIS — Z87891 Personal history of nicotine dependence: Secondary | ICD-10-CM | POA: Diagnosis not present

## 2021-12-17 HISTORY — PX: TOTAL HIP ARTHROPLASTY: SHX124

## 2021-12-17 SURGERY — ARTHROPLASTY, HIP, TOTAL,POSTERIOR APPROACH
Anesthesia: Spinal | Site: Hip | Laterality: Left

## 2021-12-17 MED ORDER — KETOROLAC TROMETHAMINE 30 MG/ML IJ SOLN
INTRAMUSCULAR | Status: AC
  Administered 2021-12-17: 30 mg via INTRAVENOUS
  Filled 2021-12-17: qty 1

## 2021-12-17 MED ORDER — FENTANYL CITRATE (PF) 100 MCG/2ML IJ SOLN
INTRAMUSCULAR | Status: DC | PRN
  Administered 2021-12-17: 25 ug via INTRAVENOUS

## 2021-12-17 MED ORDER — TRANEXAMIC ACID 1000 MG/10ML IV SOLN
INTRAVENOUS | Status: DC | PRN
  Administered 2021-12-17: 1000 mg via TOPICAL

## 2021-12-17 MED ORDER — TRANEXAMIC ACID 1000 MG/10ML IV SOLN
INTRAVENOUS | Status: AC
  Filled 2021-12-17: qty 10

## 2021-12-17 MED ORDER — CEFAZOLIN SODIUM-DEXTROSE 2-4 GM/100ML-% IV SOLN
INTRAVENOUS | Status: AC
  Filled 2021-12-17: qty 100

## 2021-12-17 MED ORDER — PHENYLEPHRINE HCL (PRESSORS) 10 MG/ML IV SOLN
INTRAVENOUS | Status: AC
  Filled 2021-12-17: qty 1

## 2021-12-17 MED ORDER — PROMETHAZINE HCL 25 MG/ML IJ SOLN: 6.2500 mg | INTRAMUSCULAR | Status: DC | PRN

## 2021-12-17 MED ORDER — KETOROLAC TROMETHAMINE 30 MG/ML IJ SOLN: 30.0000 mg | Freq: Once | INTRAMUSCULAR | Status: AC

## 2021-12-17 MED ORDER — FENTANYL CITRATE (PF) 100 MCG/2ML IJ SOLN
INTRAMUSCULAR | Status: AC
  Filled 2021-12-17: qty 2

## 2021-12-17 MED ORDER — ONDANSETRON HCL 4 MG/2ML IJ SOLN
INTRAMUSCULAR | Status: AC
  Filled 2021-12-17: qty 2

## 2021-12-17 MED ORDER — TRANEXAMIC ACID-NACL 1000-0.7 MG/100ML-% IV SOLN
1000.0000 mg | INTRAVENOUS | Status: AC
  Administered 2021-12-17: 1000 mg via INTRAVENOUS
  Filled 2021-12-17: qty 100

## 2021-12-17 MED ORDER — TRIAMCINOLONE ACETONIDE 40 MG/ML IJ SUSP
INTRAMUSCULAR | Status: AC
  Filled 2021-12-17: qty 2

## 2021-12-17 MED ORDER — LIDOCAINE HCL (PF) 2 % IJ SOLN
INTRAMUSCULAR | Status: AC
  Filled 2021-12-17: qty 5

## 2021-12-17 MED ORDER — FENTANYL CITRATE (PF) 100 MCG/2ML IJ SOLN: 25.0000 ug | INTRAMUSCULAR | Status: DC | PRN

## 2021-12-17 MED ORDER — BUPIVACAINE LIPOSOME 1.3 % IJ SUSP
INTRAMUSCULAR | Status: AC
  Filled 2021-12-17: qty 20

## 2021-12-17 MED ORDER — SODIUM CHLORIDE 0.9 % BOLUS PEDS
250.0000 mL | Freq: Once | INTRAVENOUS | Status: AC
  Administered 2021-12-17: 250 mL via INTRAVENOUS

## 2021-12-17 MED ORDER — CEFAZOLIN SODIUM-DEXTROSE 2-4 GM/100ML-% IV SOLN
INTRAVENOUS | Status: AC
  Administered 2021-12-17: 2 g via INTRAVENOUS
  Filled 2021-12-17: qty 100

## 2021-12-17 MED ORDER — PROPOFOL 10 MG/ML IV BOLUS
INTRAVENOUS | Status: AC
  Filled 2021-12-17: qty 20

## 2021-12-17 MED ORDER — CHLORHEXIDINE GLUCONATE 0.12 % MT SOLN
OROMUCOSAL | Status: AC
  Administered 2021-12-17: 15 mL via OROMUCOSAL
  Filled 2021-12-17: qty 15

## 2021-12-17 MED ORDER — BUPIVACAINE HCL (PF) 0.5 % IJ SOLN
INTRAMUSCULAR | Status: DC | PRN
  Administered 2021-12-17: 3 mL

## 2021-12-17 MED ORDER — GLYCOPYRROLATE 0.2 MG/ML IJ SOLN
INTRAMUSCULAR | Status: DC | PRN
  Administered 2021-12-17: .2 mg via INTRAVENOUS

## 2021-12-17 MED ORDER — TRIAMCINOLONE ACETONIDE 40 MG/ML IJ SUSP
INTRAMUSCULAR | Status: DC | PRN
  Administered 2021-12-17: 93 mL

## 2021-12-17 MED ORDER — MIDAZOLAM HCL 5 MG/5ML IJ SOLN
INTRAMUSCULAR | Status: DC | PRN
  Administered 2021-12-17: 2 mg via INTRAVENOUS

## 2021-12-17 MED ORDER — OXYCODONE HCL 5 MG PO TABS: 5.0000 mg | ORAL_TABLET | ORAL | 0 refills | Status: AC | PRN

## 2021-12-17 MED ORDER — GLYCOPYRROLATE 0.2 MG/ML IJ SOLN
INTRAMUSCULAR | Status: AC
  Filled 2021-12-17: qty 1

## 2021-12-17 MED ORDER — ROCURONIUM BROMIDE 10 MG/ML (PF) SYRINGE
PREFILLED_SYRINGE | INTRAVENOUS | Status: AC
  Filled 2021-12-17: qty 10

## 2021-12-17 MED ORDER — MIDAZOLAM HCL 2 MG/2ML IJ SOLN
INTRAMUSCULAR | Status: AC
  Filled 2021-12-17: qty 2

## 2021-12-17 MED ORDER — DEXAMETHASONE SODIUM PHOSPHATE 10 MG/ML IJ SOLN
INTRAMUSCULAR | Status: AC
  Filled 2021-12-17: qty 1

## 2021-12-17 MED ORDER — PHENYLEPHRINE HCL-NACL 20-0.9 MG/250ML-% IV SOLN
INTRAVENOUS | Status: DC | PRN
  Administered 2021-12-17: 45 ug/min via INTRAVENOUS

## 2021-12-17 MED ORDER — ONDANSETRON HCL 4 MG/2ML IJ SOLN: 4.0000 mg | Freq: Four times a day (QID) | INTRAMUSCULAR | Status: DC | PRN

## 2021-12-17 MED ORDER — SODIUM CHLORIDE 0.9 % IR SOLN
Status: DC | PRN
  Administered 2021-12-17: 3000 mL

## 2021-12-17 MED ORDER — ONDANSETRON HCL 4 MG PO TABS: 4.0000 mg | ORAL_TABLET | Freq: Four times a day (QID) | ORAL | Status: DC | PRN

## 2021-12-17 MED ORDER — CEFAZOLIN SODIUM-DEXTROSE 2-4 GM/100ML-% IV SOLN: 2.0000 g | Freq: Four times a day (QID) | INTRAVENOUS | Status: DC

## 2021-12-17 MED ORDER — PROPOFOL 500 MG/50ML IV EMUL
INTRAVENOUS | Status: DC | PRN
  Administered 2021-12-17: 70 ug/kg/min via INTRAVENOUS

## 2021-12-17 MED ORDER — SODIUM CHLORIDE FLUSH 0.9 % IV SOLN
INTRAVENOUS | Status: AC
  Filled 2021-12-17: qty 40

## 2021-12-17 MED ORDER — METOCLOPRAMIDE HCL 10 MG PO TABS: 5.0000 mg | ORAL_TABLET | Freq: Three times a day (TID) | ORAL | Status: DC | PRN

## 2021-12-17 MED ORDER — SODIUM CHLORIDE 0.9 % IV SOLN: INTRAVENOUS | Status: DC

## 2021-12-17 MED ORDER — OXYCODONE HCL 5 MG PO TABS
5.0000 mg | ORAL_TABLET | ORAL | Status: DC | PRN
  Administered 2021-12-17: 5 mg via ORAL

## 2021-12-17 MED ORDER — APIXABAN 2.5 MG PO TABS: 2.5000 mg | ORAL_TABLET | Freq: Two times a day (BID) | ORAL | 0 refills | Status: AC

## 2021-12-17 MED ORDER — METOCLOPRAMIDE HCL 5 MG/ML IJ SOLN: 5.0000 mg | Freq: Three times a day (TID) | INTRAMUSCULAR | Status: DC | PRN

## 2021-12-17 MED ORDER — OXYCODONE HCL 5 MG PO TABS
ORAL_TABLET | ORAL | Status: AC
  Filled 2021-12-17: qty 1

## 2021-12-17 MED ORDER — BUPIVACAINE-EPINEPHRINE (PF) 0.5% -1:200000 IJ SOLN
INTRAMUSCULAR | Status: AC
  Filled 2021-12-17: qty 30

## 2021-12-17 MED ORDER — FAMOTIDINE 20 MG PO TABS
ORAL_TABLET | ORAL | Status: AC
  Administered 2021-12-17: 20 mg via ORAL
  Filled 2021-12-17: qty 1

## 2021-12-17 MED ORDER — SODIUM CHLORIDE 0.9 % IV BOLUS
250.0000 mL | Freq: Once | INTRAVENOUS | Status: AC
  Administered 2021-12-17: 250 mL via INTRAVENOUS

## 2021-12-17 SURGICAL SUPPLY — 59 items
ACE SHELL 3H 52 E HIP (Shell) ×1 IMPLANT
APL PRP STRL LF DISP 70% ISPRP (MISCELLANEOUS) ×1
BLADE SAGITTAL WIDE XTHICK NO (BLADE) ×1 IMPLANT
BLADE SURG SZ20 CARB STEEL (BLADE) ×1 IMPLANT
CHLORAPREP W/TINT 26 (MISCELLANEOUS) ×1 IMPLANT
DRAPE 3/4 80X56 (DRAPES) ×1 IMPLANT
DRAPE IMP U-DRAPE 54X76 (DRAPES) IMPLANT
DRAPE INCISE IOBAN 66X60 STRL (DRAPES) ×1 IMPLANT
DRAPE SURG 17X11 SM STRL (DRAPES) ×2 IMPLANT
DRSG MEPILEX SACRM 8.7X9.8 (GAUZE/BANDAGES/DRESSINGS) ×1 IMPLANT
DRSG OPSITE POSTOP 4X10 (GAUZE/BANDAGES/DRESSINGS) ×1 IMPLANT
ELECT CAUTERY BLADE 6.4 (BLADE) ×1 IMPLANT
GAUZE 4X4 16PLY ~~LOC~~+RFID DBL (SPONGE) ×1 IMPLANT
GAUZE XEROFORM 1X8 LF (GAUZE/BANDAGES/DRESSINGS) ×1 IMPLANT
GLOVE BIO SURGEON STRL SZ7.5 (GLOVE) ×4 IMPLANT
GLOVE BIO SURGEON STRL SZ8 (GLOVE) ×4 IMPLANT
GLOVE BIOGEL PI IND STRL 8 (GLOVE) ×1 IMPLANT
GLOVE SURG UNDER LTX SZ8 (GLOVE) ×1 IMPLANT
GOWN STRL REUS W/ TWL LRG LVL3 (GOWN DISPOSABLE) ×1 IMPLANT
GOWN STRL REUS W/ TWL XL LVL3 (GOWN DISPOSABLE) ×1 IMPLANT
GOWN STRL REUS W/TWL LRG LVL3 (GOWN DISPOSABLE) ×1
GOWN STRL REUS W/TWL XL LVL3 (GOWN DISPOSABLE) ×1
HEAD CERAMIC BIOLOX 36MM (Head) IMPLANT
HIP SLEEVE BIOLOX -6MM OFFSET (Sleeve) ×1 IMPLANT
HOLSTER ELECTROSUGICAL PENCIL (MISCELLANEOUS) ×1 IMPLANT
HOOD PEEL AWAY FLYTE STAYCOOL (MISCELLANEOUS) ×3 IMPLANT
IV NS IRRIG 3000ML ARTHROMATIC (IV SOLUTION) ×1 IMPLANT
KIT TURNOVER KIT A (KITS) ×1 IMPLANT
LINER ACE G7 36 HIGH WALL (Liner) IMPLANT
MANIFOLD NEPTUNE II (INSTRUMENTS) ×1 IMPLANT
MAT ABSORB  FLUID 56X50 GRAY (MISCELLANEOUS) ×1
MAT ABSORB FLUID 56X50 GRAY (MISCELLANEOUS) ×1 IMPLANT
NDL FILTER BLUNT 18X1 1/2 (NEEDLE) ×1 IMPLANT
NDL SAFETY ECLIP 18X1.5 (MISCELLANEOUS) ×2 IMPLANT
NDL SPNL 20GX3.5 QUINCKE YW (NEEDLE) ×1 IMPLANT
NEEDLE FILTER BLUNT 18X 1/2SAF (NEEDLE) ×1
NEEDLE FILTER BLUNT 18X1 1/2 (NEEDLE) ×1 IMPLANT
NEEDLE SPNL 20GX3.5 QUINCKE YW (NEEDLE) ×1 IMPLANT
PACK HIP PROSTHESIS (MISCELLANEOUS) ×1 IMPLANT
PENCIL SMOKE EVACUATOR (MISCELLANEOUS) ×1 IMPLANT
PIN STEINMAN 3/16 (PIN) ×1 IMPLANT
PULSAVAC PLUS IRRIG FAN TIP (DISPOSABLE) ×1
SHELL ACETAB 3H 52 E HIP (Shell) IMPLANT
SLEEVE HIP BIOLOX -6MM OFFSET (Sleeve) IMPLANT
SPONGE T-LAP 18X18 ~~LOC~~+RFID (SPONGE) ×5 IMPLANT
STAPLER SKIN PROX 35W (STAPLE) ×1 IMPLANT
STEM FEMORAL SZ12 140MM (Stem) IMPLANT
SUT TICRON 2-0 30IN 311381 (SUTURE) ×3 IMPLANT
SUT VIC AB 0 CT1 36 (SUTURE) ×1 IMPLANT
SUT VIC AB 1 CT1 36 (SUTURE) ×1 IMPLANT
SUT VIC AB 2-0 CT1 (SUTURE) ×3 IMPLANT
SYR 10ML LL (SYRINGE) ×1 IMPLANT
SYR 20ML LL LF (SYRINGE) ×1 IMPLANT
SYR 30ML LL (SYRINGE) ×2 IMPLANT
TAPE TRANSPORE STRL 2 31045 (GAUZE/BANDAGES/DRESSINGS) ×1 IMPLANT
TIP FAN IRRIG PULSAVAC PLUS (DISPOSABLE) ×1 IMPLANT
TRAP FLUID SMOKE EVACUATOR (MISCELLANEOUS) ×2 IMPLANT
WATER STERILE IRR 1000ML POUR (IV SOLUTION) ×1 IMPLANT
WATER STERILE IRR 500ML POUR (IV SOLUTION) ×1 IMPLANT

## 2021-12-17 NOTE — Evaluation (Signed)
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 History of HIV, Syphilis, previous right THA.   Clinical Impression  The patient was agreeable to PT evaluation. The patient and son requested for the son to interpret as patient's primary language is not [place]. The patient lives at home with his spouse and son and was independent with mobility prior to this surgery. Prior THA on the right with rolling walker at home.  The patient is demonstrating high level of functional mobility post-op. Gait training initiated using rolling walker with improving gait pattern with increased ambulation distance. Stair training completed without difficulty. Reviewed posterior hip precautions with handout provided and patient was able to demonstrate understanding with functional mobility. The patient is requesting to return home today and the son is in agreement. PT will follow up tomorrow if the patient does not discharge home today to continue to maximize independence and facilitate return to prior level of function. Mobility is adequate for discharge home today with family support.      Recommendations for follow up therapy are one component of a multi-disciplinary discharge planning process, led by the attending physician.  Recommendations may be updated based on patient status, additional functional criteria and insurance authorization.  Follow Up Recommendations Home health PT      Assistance Recommended at Discharge Set up Supervision/Assistance  Patient can return home with the following  A little help with bathing/dressing/bathroom;Assist for transportation;Help with stairs or ramp for entrance;Assistance with cooking/housework    Equipment Recommendations BSC/3in1 (3-in-1 has been  delivered to bedside already)  Recommendations for Other Services       Functional Status Assessment Patient has had a recent decline in their functional status and demonstrates the ability to make significant improvements in function in a reasonable and predictable amount of time.     Precautions / Restrictions Precautions Precautions: Fall;Posterior Hip Precaution Booklet Issued: Yes (comment) Restrictions Weight Bearing Restrictions: Yes LLE Weight Bearing: Weight bearing as tolerated      Mobility  Bed Mobility Overal bed mobility: Needs Assistance Bed Mobility: Supine to Sit     Supine to sit: Min guard, HOB elevated     General bed mobility comments: verbal cues for technique    Transfers Overall transfer level: Needs assistance Equipment used: Rolling walker (2 wheels) Transfers: Sit to/from Stand Sit to Stand: Min assist           General transfer comment: cues for technique to maintain hip precuations with standing. lifting assistance provided    Ambulation/Gait Ambulation/Gait assistance: Min guard, Supervision Gait Distance (Feet): 120 Feet Assistive device: Rolling walker (2 wheels) Gait Pattern/deviations: Step-to pattern, Step-through pattern, Decreased stance time - left Gait velocity: decreased     General Gait Details: step to initially progressing to step through pattern with increased ambulation distance. reinforcement of technique using rolling walker  Stairs Stairs: Yes Stairs assistance: Supervision Stair Management: Two rails, Step to pattern, Forwards Number of Stairs: 4 General stair comments: with initial verbal cues for technique and sequencing, patient demonstrated correct technique  Wheelchair Mobility    Modified Rankin (Stroke Patients Only)       Balance Overall balance assessment: Needs assistance Sitting-balance support: Feet supported Sitting balance-Leahy Scale: Fair     Standing balance support: Bilateral  upper extremity supported, During functional activity Standing balance-Leahy  Scale: Fair Standing balance comment: with RW for UE support in standing. no external support required                             Pertinent Vitals/Pain Pain Assessment Pain Assessment: 0-10 Pain Score: 4  Pain Location: L hip Pain Descriptors / Indicators: Discomfort Pain Intervention(s): Limited activity within patient's tolerance, Ice applied, Monitored during session, Repositioned    Home Living Family/patient expects to be discharged to:: Private residence Living Arrangements: Spouse/significant other;Children (spouse and son) Available Help at Discharge: Family;Available 24 hours/day Type of Home: House Home Access: Stairs to enter Entrance Stairs-Rails: None Entrance Stairs-Number of Steps: 1 stoop Alternate Level Stairs-Number of Steps: 15 Home Layout: Two level Home Equipment: Agricultural consultant (2 wheels)      Prior Function Prior Level of Function : Independent/Modified Independent             Mobility Comments: ambulation without device       Hand Dominance        Extremity/Trunk Assessment   Upper Extremity Assessment Upper Extremity Assessment: Overall WFL for tasks assessed    Lower Extremity Assessment Lower Extremity Assessment: LLE deficits/detail LLE Deficits / Details: patient able to activate hip/knee/ankle movement and weight bear without knee buckling LLE Sensation:  (mild numbness reported on the top of the foot that did not impact functional independence or mobility)       Communication   Communication: Prefers language other than English (patient/family requesting for son to translate during session)  Cognition Arousal/Alertness: Awake/alert Behavior During Therapy: WFL for tasks assessed/performed Overall Cognitive Status: Within Functional Limits for tasks assessed                                 General Comments: patient is able  to follow all commands without difficulty        General Comments General comments (skin integrity, edema, etc.): reviewed all hip precuations and car transfer technique with patient and son. the patient is requesting to go home today    Exercises     Assessment/Plan    PT Assessment Patient needs continued PT services  PT Problem List Decreased strength;Decreased range of motion;Decreased activity tolerance;Decreased balance;Decreased mobility;Pain       PT Treatment Interventions DME instruction;Gait training;Stair training;Functional mobility training;Therapeutic activities;Balance training;Therapeutic exercise;Neuromuscular re-education;Patient/family education;Cognitive remediation    PT Goals (Current goals can be found in the Care Plan section)  Acute Rehab PT Goals Patient Stated Goal: to go home today PT Goal Formulation: With patient/family Time For Goal Achievement: 12/31/21 Potential to Achieve Goals: Good    Frequency BID     Co-evaluation               AM-PAC PT "6 Clicks" Mobility  Outcome Measure Help needed turning from your back to your side while in a flat bed without using bedrails?: None Help needed moving from lying on your back to sitting on the side of a flat bed without using bedrails?: A Little Help needed moving to and from a bed to a chair (including a wheelchair)?: A Little Help needed standing up from a chair using your arms (e.g., wheelchair or bedside chair)?: A Little Help needed to walk in hospital room?: A Little Help needed climbing 3-5 steps with a railing? : A Little 6 Click Score: 19    End of Session Equipment Utilized  During Treatment: Gait belt Activity Tolerance: Patient tolerated treatment well Patient left: with call bell/phone within reach;with family/visitor present (seated on edge of bed per patient request) Nurse Communication: Mobility status PT Visit Diagnosis: Other abnormalities of gait and mobility  (R26.89);Pain Pain - Right/Left: Left Pain - part of body: Hip    Time: 1217-1247 PT Time Calculation (min) (ACUTE ONLY): 30 min   Charges:   PT Evaluation $PT Eval Low Complexity: 1 Low PT Treatments $Gait Training: 8-22 mins        Donna Bernard, PT, MPT   Ina Homes 12/17/2021, 1:28 PM

## 2021-12-17 NOTE — Anesthesia Procedure Notes (Signed)
Spinal  Patient location during procedure: OR Start time: 12/17/2021 7:35 AM End time: 12/17/2021 7:44 AM Reason for block: surgical anesthesia Staffing Performed: anesthesiologist and resident/CRNA  Anesthesiologist: Lenard Simmer, MD Resident/CRNA: Berniece Pap, CRNA Performed by: Berniece Pap, CRNA Authorized by: Lenard Simmer, MD   Preanesthetic Checklist Completed: patient identified, IV checked, site marked, risks and benefits discussed, surgical consent, monitors and equipment checked, pre-op evaluation and timeout performed Spinal Block Patient position: sitting Prep: DuraPrep Patient monitoring: heart rate, cardiac monitor, continuous pulse ox and blood pressure Approach: midline Location: L3-4 Injection technique: single-shot Needle Needle type: Sprotte  Needle gauge: 24 G Needle length: 9 cm Assessment Sensory level: T4 Events: CSF return

## 2021-12-17 NOTE — TOC Progression Note (Signed)
Transition of Care [facility]) - Progression Note    Patient Details  Name: Donald Barnett MRN: [id number] Date of Birth: April 26, 1960  [facility]) CM/SW Contact  Marlowe Sax, RN Phone Number: 12/17/2021, 11:28 AM  Clinical Narrative:     The patient is set up with Outpatient pT, he has a rolling walker and a 3 in 1 is to be delivered to the bedside prior to DC       Expected Discharge Plan and Services           Expected Discharge Date: 12/17/21                                     Social Determinants of Health (SDOH) Interventions    Readmission Risk Interventions     No data to display

## 2021-12-17 NOTE — Op Note (Signed)
12/17/2021  9:57 AM  Patient:   Donald Barnett  Pre-Op Diagnosis:   Degenerative joint disease with underlying avascular necrosis left, left hip.  Post-Op Diagnosis:   Same.  Procedure:   Left total hip arthroplasty.  Surgeon:   Maryagnes Amos, MD  Assistant:   Horris Latino, PA-C  Anesthesia:   Spinal  Findings:   As above.  Complications:   None  EBL:   75 cc  Fluids:   800 cc crystalloid  UOP:   None  TT:   None  Drains:   None  Closure:   Staples  Implants:   Biomet press-fit system with a #12 laterally offset Echo femoral stem, a 52 mm acetabular shell with an E-poly hi-wall liner, and a 36 mm ceramic head with a -6 mm neck adapter.  Brief Clinical Note:   The patient is a 61 year old male with a history of progressively worsening left hip pain. His symptoms have progressed despite medications, activity modification, etc. His history and examination consistent with degenerative joint disease. A preoperative scan also confirmed the presence of underlying avascular necrosis of the femoral head. The patient presents at this time for a left total hip arthroplasty.   Procedure:   The patient was brought into the operating room. After adequate spinal anesthesia was obtained, the patient was repositioned in the right lateral decubitus position and secured using a lateral hip positioner. The left hip and lower extremity were prepped with ChloroPrep solution before being draped sterilely. Preoperative antibiotics were administered. A timeout was performed to verify the appropriate surgical site.    A standard posterior approach to the hip was made through an approximately 4-5 inch incision. The incision was carried down through the subcutaneous tissues to expose the gluteal fascia and proximal end of the iliotibial band. These structures were split the length of the incision and the Charnley self-retaining hip retractor placed. The bursal tissues were swept posteriorly to  expose the short external rotators. The anterior border of the piriformis tendon was identified and this plane developed down through the capsule to enter the joint. A flap of tissue was elevated off the posterior aspect of the femoral neck and greater trochanter and retracted posteriorly. This flap included the piriformis tendon, the short external rotators, and the posterior capsule. The soft tissues were elevated off the lateral aspect of the ilium and a large Steinmann pin placed bicortically.   With the left leg aligned over the right, a drill bit was placed into the greater trochanter parallel to the Steinmann pin and the distance between these two pins measured in order to optimize leg lengths postoperatively. The drill bit was removed and the hip dislocated. The piriformis fossa was debrided of soft tissues before the intramedullary canal was accessed through this point using a triple step reamer. The canal was reamed sequentially beginning with a #7 tapered reamer and progressing to a #12 tapered reamer. This provided excellent circumferential chatter. Using the appropriate guide, a femoral neck cut was made 10-12 mm above the lesser trochanter. The femoral head was removed.  Attention was directed to the acetabular side. The labrum was debrided circumferentially before the ligamentum teres was removed using a large curette. A line was drawn on the drapes corresponding to the native version of the acetabulum. This line was used as a guide while the acetabulum was reamed sequentially beginning with a 43 mm reamer and progressing to a 51 mm reamer. This provided excellent circumferential chatter. The 51 mm trial  acetabulum was positioned and found to fit quite well. Therefore, the 52 mm acetabular shell was selected and impacted into place with care taken to maintain the appropriate version. The trial high wall liner was inserted.  Attention was redirected to the femoral side. A box osteotome was used  to establish version before the canal was broached sequentially beginning with a #7 broach and progressing to a #12 broach. This was left in place and several trial reductions performed using both the standard and laterally offset neck options, as well as the -6 mm and -3 mm neck lengths. After removing the trial components, the "manhole cover" was placed into the apex of the acetabular shell and tightened securely. The permanent E-polyethylene hi-wall liner was impacted into the acetabular shell and its locking mechanism verified using a quarter-inch osteotome. Next, the #12 laterally offset femoral stem was impacted into place with care taken to maintain the appropriate version. A repeat trial reduction was performed using the -6 mm and -3 mm neck lengths. The -6 mm neck length demonstrated excellent stability both in extension and external rotation as well as with flexion to 90 and internal rotation beyond 70. It also was stable in the position of sleep. In addition, leg lengths appeared to be restored appropriately, both by reassessing the position of the right leg over the left, as well as by measuring the distance between the Steinmann pin and the drill bit. The 36 mm ceramic head with the -6 mm neck adapter was impacted onto the stem of the femoral component. The Morse taper locking mechanism was verified using manual distraction before the head was relocated and placed through a range of motion with the findings as described above.  The wound was copiously irrigated with sterile saline solution via the jet lavage system before the peri-incisional and pericapsular tissues were injected with a solution comprised of 30 cc of 0.5% Sensorcaine with epinephrine, 20 cc of Exparel, 2 cc of Kenalog 40 (80 mg), and 15 mg of Toradol diluted out to 90 cc with normal saline to help with postoperative analgesia. The posterior flap was reapproximated to the posterior aspect of the greater trochanter using #2 Tycron  interrupted sutures placed through drill holes. Several additional #2 Tycron interrupted sutures were used to reinforce this layer of closure. The iliotibial band was reapproximated using #1 Vicryl interrupted sutures before the gluteal fascia was closed using a running #1 Vicryl suture. At this point, 1 g of transexemic acid in 10 cc of normal saline was injected into the joint to help reduce postoperative bleeding. The subcutaneous tissues were closed in several layers using 2-0 Vicryl interrupted sutures before the skin was closed using staples. A sterile occlusive dressing was applied to the wound. The patient was then rolled back into the supine position on his/her hospital bed before being awakened and returned to the recovery room in satisfactory condition after tolerating the procedure well.

## 2021-12-17 NOTE — H&P (Signed)
History of Present Illness: Donald Barnett is a 61 y.o. male who presents today for his surgical history and physical for upcoming left total hip arthroplasty scheduled with Dr. Joice Lofts on 12/17/2021. The patient is status post a right total hip arthroplasty and has recovered well from his previous surgery. The patient denies any changes in his medical history since he was last evaluated. He denies any personal history of heart attack, stroke, asthma or COPD. The patient does have a history of HIV. The patient denies any personal history of blood clot. The patient denies any falls or trauma affecting the left hip since his last evaluation. Pain still radiates from the buttock into the groin and does go down into the left knee. The patient has not been applying any ice or heat. The patient is taken Tylenol and occasional hydrocodone for discomfort. The patient reports a 9 out of 10 pain score in the left hip. He is very pleased with his right total hip arthroplasty in the past.  Past Medical History: HIV, symptomatic (CMS-HCC)  Syphilis   Past Surgical History: Right total hip arthroplasty Right 09/12/2021 (Dr. Joice Lofts)   Past Family History: No Known Problems Mother  No Known Problems Father  No Known Problems Son  No Known Problems Other  No Known Problems Daughter   Medications: acetaminophen (TYLENOL) 500 MG tablet Take 1,000 mg by mouth every 6 (six) hours as needed  amLODIPine (NORVASC) 5 MG tablet TAKE 1 TABLET(5 MG) BY MOUTH EVERY DAY 90 tablet 3  bictegravir-emtricitabine-tenofovir alafenamide (BIKTARVY) 50-200-25 mg tablet Take 1 tablet by mouth once daily 90 tablet 3  HYDROcodone-acetaminophen (NORCO) 5-325 mg tablet Take 1 tablet by mouth 2 (two) times daily as needed for Pain 30 tablet 0   Current Facility-Administered Medications:  cyanocobalamin (VITAMIN B12) injection 1,000 mcg 1,000 mcg Intramuscular Q30 Days Dierdre Harness, MD 1,000 mcg at 06/28/20 1023    Allergies: Bactrim [Sulfamethoxazole-Trimethoprim] Rash   Review of Systems:  A comprehensive 14 point ROS was performed, reviewed by me today, and the pertinent orthopaedic findings are documented in the HPI.  Physical Exam: BP 132/82 (BP Location: Left upper arm, Patient Position: Sitting, BP Cuff Size: Adult)  Ht 172.7 cm (5\' 8" )  Wt 76.9 kg (169 lb 9.6 oz)  BMI 25.79 kg/m  General/Constitutional: The patient appears to be well-nourished, well-developed, and in no acute distress. Neuro/Psych: Normal mood and affect, oriented to person, place and time. Eyes: Non-icteric. Pupils are equal, round, and reactive to light, and exhibit synchronous movement. ENT: Unremarkable. Lymphatic: No palpable adenopathy. Respiratory: Lungs clear to auscultation, Normal chest excursion, No wheezes, and Non-labored breathing Cardiovascular: Regular rate and rhythm. No murmurs. and No edema, swelling or tenderness, except as noted in detailed exam. Integumentary: No impressive skin lesions present, except as noted in detailed exam. Musculoskeletal: Unremarkable, except as noted in detailed exam.  Left hip exam: Skin inspection of the left hip is unremarkable. No swelling, erythema, ecchymosis, abrasions, or other skin abnormalities are identified. He has no tenderness to palpation of the anterior or lateral aspects of the left hip. His hip can be flexed to 95 degrees, internally rotated to 10 degrees, and externally rotated to 35 degrees. He notes mild-moderate pain at the extremes of these motions. He is neurovascular intact to the left lower extremity and foot.  Imaging: Previous x-rays of the left hip were reviewed at today's visit. These x-rays demonstrate significant osteophyte formation with complete loss of superior acetabular joint space. The patient does have  moderate cystic changes throughout the superior aspect of the acetabulum in addition to the femoral head. Flattening of the femoral head  is also noted. No evidence of acute fracture or lytic lesions identified.  Impression: 1. Primary osteoarthritis of left hip. 2. 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  Plan:  1. Treatment options were discussed today with the patient. 2. The patient is scheduled to undergo a left total hip arthroplasty with Dr. Joice Lofts on 12/17/2021. 3. The patient was instructed on the risk and benefits of surgery and wishes to proceed at this time. 4. This document will serve as a surgical history and physical for the patient. 5. The patient will follow-up per standard postop protocol. They can call the clinic they have any questions, new symptoms develop or symptoms worsen.  The procedure was discussed with the patient, as were the potential risks (including bleeding, infection, nerve and/or blood vessel injury, persistent or recurrent pain, failure of the hardware, leg length inequality, dislocation, need for further surgery, blood clots, strokes, heart attacks and/or arhythmias, pneumonia, etc.) and benefits. The patient states his understanding and wishes to proceed.   H&P reviewed and patient re-examined. No changes.

## 2021-12-17 NOTE — Progress Notes (Cosign Needed)
Patient is not able to walk the distance required to go the bathroom, or he/she is unable to safely negotiate stairs required to access the bathroom.  A 3in1 BSC will alleviate this problem  

## 2021-12-17 NOTE — Transfer of Care (Signed)
Immediate Anesthesia Transfer of Care Note  Patient: Vue Stucky  Procedure(s) Performed: TOTAL HIP ARTHROPLASTY (Left: Hip)  Patient Location: PACU  Anesthesia Type:Spinal  Level of Consciousness: drowsy  Airway & Oxygen Therapy: Patient Spontanous Breathing and Patient connected to face mask oxygen  Post-op Assessment: Report given to RN and Post -op Vital signs reviewed and stable  Post vital signs: Reviewed and stable  Last Vitals:  Vitals Value Taken Time  BP 92/61 12/17/21 0951  Temp    Pulse 62 12/17/21 0952  Resp 16 12/17/21 0952  SpO2 100 % 12/17/21 0952  Vitals shown include unvalidated device data.  Last Pain:  Vitals:   12/17/21 0620  TempSrc: Temporal  PainSc: 9          Complications: No notable events documented.

## 2021-12-17 NOTE — Anesthesia Preprocedure Evaluation (Signed)
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  BMI    Body Mass Index: 28.49 kg/m      Reproductive/Obstetrics negative OB ROS                             Anesthesia Physical  Anesthesia Plan  ASA: 2  Anesthesia Plan: Spinal   Post-op Pain Management:    Induction: Intravenous  PONV Risk Score and Plan: 1 and TIVA and Propofol infusion  Airway Management Planned: Natural Airway and Simple Face Mask  Additional Equipment:   Intra-op Plan:   Post-operative Plan:   Informed Consent: I have reviewed the patients History and Physical, chart, labs and discussed the procedure including the risks, benefits and alternatives for the proposed anesthesia with the patient or authorized representative who has indicated his/her understanding and acceptance.     Dental Advisory Given  Plan Discussed  with: Anesthesiologist, CRNA and Surgeon  Anesthesia Plan Comments:         Anesthesia Quick Evaluation

## 2021-12-17 NOTE — Discharge Instructions (Addendum)
Orthopedic discharge instructions: May sponge bathe or shower with intact OpSite dressing. Apply ice frequently to hip. Start Eliquis 1 tablet (2.5 mg) twice daily on Wednesday, 12/18/2021, for 2 weeks, then take aspirin 325 mg twice daily for 4 weeks. Take pain medication as prescribed when needed.  May supplement with ES Tylenol if necessary. May weight-bear as tolerated on right leg - use walker for balance and support. Follow-up in 10-14 days or as scheduled       .AMBULATORY SURGERY  DISCHARGE INSTRUCTIONS   The drugs that you were given will stay in your system until tomorrow so for the next 24 hours you should not:  Drive an automobile Make any legal decisions Drink any alcoholic beverage   You may resume regular meals tomorrow.  Today it is better to start with liquids and gradually work up to solid foods.  You may eat anything you prefer, but it is better to start with liquids, then soup and crackers, and gradually work up to solid foods.   Please notify your doctor immediately if you have any unusual bleeding, trouble breathing, redness and pain at the surgery site, drainage, fever, or pain not relieved by medication.     Your post-operative visit with Dr.                                       is: Date:                        Time:    Please call to schedule your post-operative visit.  Additional Instructions:

## 2021-12-18 ENCOUNTER — Encounter: Payer: Self-pay | Admitting: Surgery

## 2021-12-18 LAB — SURGICAL PATHOLOGY

## 2022-01-02 NOTE — Anesthesia Postprocedure Evaluation (Signed)
Anesthesia Post Note  Patient: Aldin Easterwood  Procedure(s) Performed: TOTAL HIP ARTHROPLASTY (Left: Hip)  Patient location during evaluation: PACU Anesthesia Type: Spinal Level of consciousness: oriented and awake and alert Pain management: pain level controlled Vital Signs Assessment: post-procedure vital signs reviewed and stable Respiratory status: spontaneous breathing, respiratory function stable and patient connected to nasal cannula oxygen Cardiovascular status: blood pressure returned to baseline and stable Postop Assessment: no headache, no backache, no apparent nausea or vomiting and spinal receding Anesthetic complications: no   No notable events documented.   Last Vitals:  Vitals:   12/17/21 1202 12/17/21 1320  BP: 137/80 (!) 156/89  Pulse: 81 79  Resp: 16 18  Temp:    SpO2: 100% 100%    Last Pain:  Vitals:   12/17/21 1320  TempSrc:   PainSc: 4                  Martha Clan

## 2023-06-29 ENCOUNTER — Other Ambulatory Visit: Payer: Self-pay | Admitting: Infectious Diseases

## 2023-06-29 DIAGNOSIS — E119 Type 2 diabetes mellitus without complications: Secondary | ICD-10-CM

## 2023-06-29 DIAGNOSIS — Z136 Encounter for screening for cardiovascular disorders: Secondary | ICD-10-CM

## 2023-06-29 DIAGNOSIS — Z21 Asymptomatic human immunodeficiency virus [HIV] infection status: Secondary | ICD-10-CM

## 2023-06-29 DIAGNOSIS — E538 Deficiency of other specified B group vitamins: Secondary | ICD-10-CM

## 2023-07-06 ENCOUNTER — Ambulatory Visit
Admission: RE | Admit: 2023-07-06 | Discharge: 2023-07-06 | Disposition: A | Discharge status: Home / Self Care | Payer: Self-pay | Source: Ambulatory Visit | Attending: Infectious Diseases | Admitting: Infectious Diseases

## 2023-07-06 DIAGNOSIS — Z21 Asymptomatic human immunodeficiency virus [HIV] infection status: Secondary | ICD-10-CM | POA: Insufficient documentation

## 2023-07-06 DIAGNOSIS — E538 Deficiency of other specified B group vitamins: Secondary | ICD-10-CM | POA: Insufficient documentation

## 2023-07-06 DIAGNOSIS — E119 Type 2 diabetes mellitus without complications: Secondary | ICD-10-CM | POA: Insufficient documentation

## 2023-07-06 DIAGNOSIS — Z136 Encounter for screening for cardiovascular disorders: Secondary | ICD-10-CM | POA: Insufficient documentation

## 2023-07-07 ENCOUNTER — Encounter: Payer: Self-pay | Admitting: Gastroenterology

## 2023-07-17 ENCOUNTER — Encounter
Admission: RE | Disposition: A | Discharge status: Home / Self Care | Payer: Self-pay | Source: Home / Self Care | Attending: Gastroenterology

## 2023-07-17 ENCOUNTER — Ambulatory Visit: Admitting: Anesthesiology

## 2023-07-17 ENCOUNTER — Encounter: Payer: Self-pay | Admitting: Gastroenterology

## 2023-07-17 ENCOUNTER — Other Ambulatory Visit: Payer: Self-pay

## 2023-07-17 ENCOUNTER — Ambulatory Visit
Admission: RE | Admit: 2023-07-17 | Discharge: 2023-07-17 | Disposition: A | Discharge status: Home / Self Care | Payer: Commercial Managed Care - HMO | Attending: Gastroenterology | Admitting: Gastroenterology

## 2023-07-17 DIAGNOSIS — R7303 Prediabetes: Secondary | ICD-10-CM | POA: Insufficient documentation

## 2023-07-17 DIAGNOSIS — D120 Benign neoplasm of cecum: Secondary | ICD-10-CM | POA: Insufficient documentation

## 2023-07-17 DIAGNOSIS — D122 Benign neoplasm of ascending colon: Secondary | ICD-10-CM | POA: Insufficient documentation

## 2023-07-17 DIAGNOSIS — D125 Benign neoplasm of sigmoid colon: Secondary | ICD-10-CM | POA: Diagnosis not present

## 2023-07-17 DIAGNOSIS — Z21 Asymptomatic human immunodeficiency virus [HIV] infection status: Secondary | ICD-10-CM | POA: Insufficient documentation

## 2023-07-17 DIAGNOSIS — Z79899 Other long term (current) drug therapy: Secondary | ICD-10-CM | POA: Insufficient documentation

## 2023-07-17 DIAGNOSIS — D124 Benign neoplasm of descending colon: Secondary | ICD-10-CM | POA: Insufficient documentation

## 2023-07-17 DIAGNOSIS — Z800 Family history of malignant neoplasm of digestive organs: Secondary | ICD-10-CM | POA: Diagnosis not present

## 2023-07-17 DIAGNOSIS — Z1211 Encounter for screening for malignant neoplasm of colon: Secondary | ICD-10-CM | POA: Diagnosis present

## 2023-07-17 DIAGNOSIS — I129 Hypertensive chronic kidney disease with stage 1 through stage 4 chronic kidney disease, or unspecified chronic kidney disease: Secondary | ICD-10-CM | POA: Insufficient documentation

## 2023-07-17 DIAGNOSIS — Z96643 Presence of artificial hip joint, bilateral: Secondary | ICD-10-CM | POA: Diagnosis not present

## 2023-07-17 DIAGNOSIS — D123 Benign neoplasm of transverse colon: Secondary | ICD-10-CM | POA: Insufficient documentation

## 2023-07-17 DIAGNOSIS — N189 Chronic kidney disease, unspecified: Secondary | ICD-10-CM | POA: Insufficient documentation

## 2023-07-17 DIAGNOSIS — F17210 Nicotine dependence, cigarettes, uncomplicated: Secondary | ICD-10-CM | POA: Diagnosis not present

## 2023-07-17 HISTORY — DX: Essential (primary) hypertension: I10

## 2023-07-17 HISTORY — DX: Prediabetes: R73.03

## 2023-07-17 HISTORY — DX: Unilateral primary osteoarthritis, left hip: M16.12

## 2023-07-17 HISTORY — PX: COLONOSCOPY WITH PROPOFOL: SHX5780

## 2023-07-17 HISTORY — DX: Pneumocystosis: B59

## 2023-07-17 SURGERY — COLONOSCOPY WITH PROPOFOL
Anesthesia: General

## 2023-07-17 MED ORDER — PROPOFOL 10 MG/ML IV BOLUS
INTRAVENOUS | Status: AC
  Filled 2023-07-17: qty 40

## 2023-07-17 MED ORDER — LIDOCAINE HCL (PF) 2 % IJ SOLN
INTRAMUSCULAR | Status: AC
  Filled 2023-07-17: qty 5

## 2023-07-17 MED ORDER — SODIUM CHLORIDE 0.9 % IV SOLN: INTRAVENOUS | Status: DC

## 2023-07-17 MED ORDER — PROPOFOL 10 MG/ML IV BOLUS
INTRAVENOUS | Status: AC
  Filled 2023-07-17: qty 20

## 2023-07-17 MED ORDER — PROPOFOL 10 MG/ML IV BOLUS
INTRAVENOUS | Status: DC | PRN
  Administered 2023-07-17: 20 mg via INTRAVENOUS
  Administered 2023-07-17: 80 mg via INTRAVENOUS
  Administered 2023-07-17: 20 mg via INTRAVENOUS
  Administered 2023-07-17: 30 mg via INTRAVENOUS

## 2023-07-17 MED ORDER — LIDOCAINE HCL (CARDIAC) PF 100 MG/5ML IV SOSY
PREFILLED_SYRINGE | INTRAVENOUS | Status: DC | PRN
  Administered 2023-07-17: 50 mg via INTRAVENOUS

## 2023-07-17 MED ORDER — PROPOFOL 500 MG/50ML IV EMUL
INTRAVENOUS | Status: DC | PRN
  Administered 2023-07-17: 100 ug/kg/min via INTRAVENOUS

## 2023-07-17 NOTE — Op Note (Signed)
 [facility] Gastroenterology Patient Name: Donald Barnett Procedure Date: 07/17/2023 11:10 AM MRN: [id number] Account #: [id number] Date of Birth: November 29, 1960 Admit Type: Outpatient Age: 63 Room: [facility] ENDO ROOM 1 Gender: Male Note Status: Finalized Instrument Name: Nelda Marseille [id number] Procedure:             Colonoscopy Indications:           Screening patient at increased risk: Family history of                         1st-degree relative with colorectal cancer at age 63                         years (or older) Providers:             Trenda Moots, DO Referring MD:          Barbette Reichmann, MD (Referring MD) Medicines:             Monitored Anesthesia Care Complications:         No immediate complications. Estimated blood loss:                         Minimal. Procedure:             Pre-Anesthesia Assessment:                        - Prior to the procedure, a History and Physical was                         performed, and patient medications and allergies were                         reviewed. The patient is competent. The risks and                         benefits of the procedure and the sedation options and                         risks were discussed with the patient. All questions                         were answered and informed consent was obtained.                         Patient identification and proposed procedure were                         verified by the physician, the nurse, the anesthetist                         and the technician in the endoscopy suite. Mental                         Status Examination: alert and oriented. Airway                         Examination: normal oropharyngeal airway and neck  mobility. Respiratory Examination: clear to                         auscultation. CV Examination: bradycardia noted.                         Prophylactic Antibiotics: The patient does not require                          prophylactic antibiotics. Prior Anticoagulants: The                         patient has taken no anticoagulant or antiplatelet                         agents. ASA Grade Assessment: II - A patient with mild                         systemic disease. After reviewing the risks and                         benefits, the patient was deemed in satisfactory                         condition to undergo the procedure. The anesthesia                         plan was to use monitored anesthesia care (MAC).                         Immediately prior to administration of medications,                         the patient was re-assessed for adequacy to receive                         sedatives. The heart rate, respiratory rate, oxygen                         saturations, blood pressure, adequacy of pulmonary                         ventilation, and response to care were monitored                         throughout the procedure. The physical status of the                         patient was re-assessed after the procedure.                        After obtaining informed consent, the colonoscope was                         passed under direct vision. Throughout the procedure,                         the patient's blood pressure, pulse, and oxygen  saturations were monitored continuously. The                         Colonoscope was introduced through the anus and                         advanced to the the cecum, identified by appendiceal                         orifice and ileocecal valve. The ileocecal valve,                         appendiceal orifice, and rectum were photographed. The                         colonoscopy was performed without difficulty. The                         patient tolerated the procedure well. The quality of                         the bowel preparation was evaluated using the BBPS                         [facility] Bowel Preparation Scale) with  scores of: Right                         Colon = 2 (minor amount of residual staining, small                         fragments of stool and/or opaque liquid, but mucosa                         seen well), Transverse Colon = 3 (entire mucosa seen                         well with no residual staining, small fragments of                         stool or opaque liquid) and Left Colon = 2 (minor                         amount of residual staining, small fragments of stool                         and/or opaque liquid, but mucosa seen well). The total                         BBPS score equals 7. The quality of the bowel                         preparation was good. The ileocecal valve, appendiceal                         orifice, and rectum were photographed. Findings:      The perianal and digital rectal examinations were normal. Pertinent  negatives include normal sphincter tone.      Ten sessile polyps were found in the sigmoid colon (1), descending colon       (5), transverse colon (2) and ascending colon (2). The polyps were 2 to       9 mm in size. These polyps were removed with a cold snare. Resection and       retrieval were complete. Estimated blood loss was minimal.      Eight sessile polyps were found in the sigmoid colon (2), descending       colon (1), transverse colon (1), ascending colon (3) and cecum (1). The       polyps were 1 to 2 mm in size. These polyps were removed with a jumbo       cold forceps. Resection and retrieval were complete. Estimated blood       loss was minimal.      The exam was otherwise without abnormality on direct and retroflexion       views. Impression:            - Ten 2 to 9 mm polyps in the sigmoid colon, in the                         descending colon, in the transverse colon and in the                         ascending colon, removed with a cold snare. Resected                         and retrieved.                        - Eight 1 to 2 mm  polyps in the sigmoid colon, in the                         descending colon, in the transverse colon, in the                         ascending colon and in the cecum, removed with a jumbo                         cold forceps. Resected and retrieved.                        - The examination was otherwise normal on direct and                         retroflexion views. Recommendation:        - Patient has a contact number available for                         emergencies. The signs and symptoms of potential                         delayed complications were discussed with the patient.                         Return to normal activities tomorrow. Written  discharge instructions were provided to the patient.                        - Discharge patient to home.                        - Resume previous diet.                        - Continue present medications.                        - No ibuprofen, naproxen, or other non-steroidal                         anti-inflammatory drugs for 5 days after polyp removal.                        - Await pathology results.                        - Repeat colonoscopy in 1 year for surveillance based                         on pathology results.                        - The findings and recommendations were discussed with                         the patient's family.                        - The findings and recommendations were discussed with                         the patient. Procedure Code(s):     --- Professional ---                        [phone], Colonoscopy, flexible; with removal of                         tumor(s), polyp(s), or other lesion(s) by snare                         technique                        [id number], 59, Colonoscopy, flexible; with biopsy, single                         or multiple Diagnosis Code(s):     --- Professional ---                        Z80.0, Family history of malignant neoplasm of                          digestive organs                        D12.5, Benign neoplasm of sigmoid colon  D12.4, Benign neoplasm of descending colon                        D12.3, Benign neoplasm of transverse colon (hepatic                         flexure or splenic flexure)                        D12.2, Benign neoplasm of ascending colon                        D12.0, Benign neoplasm of cecum CPT copyright 2022 American Medical Association. All rights reserved. The codes documented in this report are preliminary and upon coder review may  be revised to meet current compliance requirements. Attending Participation:      I personally performed the entire procedure. Elfredia Nevins, DO Jaynie Collins DO, DO 07/17/2023 11:58:53 AM This report has been signed electronically. Number of Addenda: 0 Note Initiated On: 07/17/2023 11:10 AM Scope Withdrawal Time: 0 hours 24 minutes 11 seconds  Total Procedure Duration: 0 hours 32 minutes 29 seconds  Estimated Blood Loss:  Estimated blood loss was minimal.      [facility]

## 2023-07-17 NOTE — Anesthesia Postprocedure Evaluation (Signed)
 Anesthesia Post Note  Patient: Donald Barnett  Procedure(s) Performed: COLONOSCOPY WITH PROPOFOL  Patient location during evaluation: Endoscopy Anesthesia Type: General Level of consciousness: awake and alert Pain management: pain level controlled Vital Signs Assessment: post-procedure vital signs reviewed and stable Respiratory status: spontaneous breathing, nonlabored ventilation, respiratory function stable and patient connected to nasal cannula oxygen Cardiovascular status: blood pressure returned to baseline and stable Postop Assessment: no apparent nausea or vomiting Anesthetic complications: no   No notable events documented.   Last Vitals:  Vitals:   07/17/23 1054 07/17/23 1155  BP: (!) 180/94 96/68  Pulse: (!) 59   Resp: 16   Temp: (!) 35.9 C (!) 35.8 C    Last Pain:  Vitals:   07/17/23 1215  TempSrc:   PainSc: 0-No pain                 Lenard Simmer

## 2023-07-17 NOTE — H&P (Signed)
 Pre-Procedure H&P   Patient ID: Donald Barnett is a 63 y.o. male.  Gastroenterology Provider: Jaynie Collins, DO  Referring Provider: Jacob Moores, PA PCP: Mick Sell, MD  Date: 07/17/2023  HPI [name] is a 63 y.o. male who presents today for Colonoscopy for colorectal cancer screening .  Information is garnered via daughter at bedside and video medical Gujarati interpreter Deval  Bowel movements are regular without melena or hematochezia  Patient undergoing his initial screening colonoscopy.  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 (HCC)    Pre-diabetes    Primary osteoarthritis of left hip    Syphilis     Past Surgical History:  Procedure Laterality Date   JOINT REPLACEMENT     TOTAL HIP ARTHROPLASTY Right 09/12/2021   Procedure: TOTAL HIP ARTHROPLASTY - RNFA;  Surgeon: Christena Flake, MD;  Location: [facility] ORS;  Service: Orthopedics;  Laterality: Right;   TOTAL HIP ARTHROPLASTY Left 12/17/2021   Procedure: TOTAL HIP ARTHROPLASTY;  Surgeon: Christena Flake, MD;  Location: [facility] ORS;  Service: Orthopedics;  Laterality: Left;    Family History Father- colon cancer No other h/o GI disease or malignancy  Review of Systems  Constitutional:  Negative for activity change, appetite change, chills, diaphoresis, fatigue, fever and unexpected weight change.  HENT:  Negative for trouble swallowing and voice change.   Respiratory:  Negative for shortness of breath and wheezing.   Cardiovascular:  Negative for chest pain, palpitations and leg swelling.  Gastrointestinal:  Negative for abdominal distention, abdominal pain, anal  bleeding, blood in stool, constipation, diarrhea, nausea and vomiting.  Musculoskeletal:  Negative for arthralgias and myalgias.  Skin:  Negative for color change and pallor.  Neurological:  Negative for dizziness, syncope and weakness.  Psychiatric/Behavioral:  Negative for confusion. The patient is not nervous/anxious.   All other systems reviewed and are negative.    Medications No current facility-administered medications on file prior to encounter.   Current Outpatient Medications on File Prior to Encounter  Medication Sig Dispense Refill   amLODipine (NORVASC) 5 MG tablet Take 5 mg by mouth every morning.     apixaban (ELIQUIS) 2.5 MG TABS tablet Take 1 tablet (2.5 mg total) by mouth 2 (two) times daily. 30 tablet 0   Study - A5354 - bictegravir/emtricitabine/tenofovir alafenamide (BIKTARVY) 50-200-25 mg tablet ([place]) Take 1 tablet by mouth daily.     gabapentin (NEURONTIN) 100 MG capsule Take 200 mg by mouth as needed. (Patient not taking: Reported on 07/17/2023)     oxyCODONE (ROXICODONE) 5 MG immediate release tablet Take 1-2 tablets (5-10 mg total) by mouth every 4 (four) hours as needed for moderate pain or severe pain. 40 tablet 0    Pertinent medications related to GI and procedure were reviewed by me with the patient prior to the procedure   Current Facility-Administered Medications:    0.9 %  sodium chloride infusion, , Intravenous, Continuous, Jaynie Collins, DO  sodium chloride         Allergies  Allergen Reactions   Bactrim [Sulfamethoxazole-Trimethoprim]    Allergies were reviewed by me prior to the procedure  Objective   Body mass index is 29.87 kg/m. Vitals:   07/17/23 1054  BP: (!) 180/94  Pulse: (!) 59  Resp: 16  Temp: (!) 96.6 F (35.9 C)  TempSrc: Temporal  Weight: 78.9 kg  Height: 5\' 4"  (1.626 m)     Physical Exam Vitals and nursing note reviewed.  Constitutional:      General: He is not in acute distress.    Appearance:  Normal appearance. He is not ill-appearing, toxic-appearing or diaphoretic.  HENT:     Head: Normocephalic and atraumatic.     Nose: Nose normal.     Mouth/Throat:     Mouth: Mucous membranes are moist.     Pharynx: Oropharynx is clear.  Eyes:     General: No scleral icterus.    Extraocular Movements: Extraocular movements intact.  Cardiovascular:     Rate and Rhythm: Regular rhythm. Bradycardia present.     Heart sounds: Normal heart sounds. No murmur heard.    No friction rub. No gallop.  Pulmonary:     Effort: Pulmonary effort is normal. No respiratory distress.     Breath sounds: Normal breath sounds. No wheezing, rhonchi or rales.  Abdominal:     General: Bowel sounds are normal. There is no distension.     Palpations: Abdomen is soft.     Tenderness: There is no abdominal tenderness. There is no guarding or rebound.  Musculoskeletal:     Cervical back: Neck supple.     Right lower leg: No edema.     Left lower leg: No edema.  Skin:    General: Skin is warm and dry.     Coloration: Skin is not jaundiced or pale.  Neurological:     General: No focal deficit present.     Mental Status: He is alert and oriented to person, place, and time. Mental status is at baseline.  Psychiatric:        Mood and Affect: Mood normal.        Behavior: Behavior normal.        Thought Content: Thought content normal.        Judgment: Judgment normal.      Assessment:  [name] is a 63 y.o. male  who presents today for Colonoscopy for colorectal cancer screening .  Plan:  Colonoscopy with possible intervention today  Colonoscopy with possible biopsy, control of bleeding, polypectomy, and interventions as necessary has been discussed with the patient/patient representative. Informed consent was obtained from the patient/patient representative after explaining the indication, nature, and risks of the procedure including but not limited to death, bleeding, perforation,  missed neoplasm/lesions, cardiorespiratory compromise, and reaction to medications. Opportunity for questions was given and appropriate answers were provided. Patient/patient representative has verbalized understanding is amenable to undergoing the procedure.   Jaynie Collins, DO  [facility]astroenterology  Portions of the record may have been created with voice recognition software. Occasional wrong-word or 'sound-a-like' substitutions may have occurred due to the inherent limitations of voice recognition software.  Read the chart carefully and recognize, using context, where substitutions may have occurred.

## 2023-07-17 NOTE — Interval H&P Note (Signed)
 History and Physical Interval Note: Preprocedure H&P from 07/17/23  was reviewed and there was no interval change after seeing and examining the patient.  Written consent was obtained from the patient after discussion of risks, benefits, and alternatives. Patient has consented to proceed with Colonoscopy with possible intervention   07/17/2023 11:10 AM  Donald Barnett  has presented today for surgery, with the diagnosis of Z12.11 (ICD-10-CM) - Colon cancer screening Z80.0 (ICD-10-CM) - Family history of colon cancer.  The various methods of treatment have been discussed with the patient and family. After consideration of risks, benefits and other options for treatment, the patient has consented to  Procedure(s) with comments: COLONOSCOPY WITH PROPOFOL (N/A) - Gujarati Interpreter needed as a surgical intervention.  The patient's history has been reviewed, patient examined, no change in status, stable for surgery.  I have reviewed the patient's chart and labs.  Questions were answered to the patient's satisfaction.     Jaynie Collins

## 2023-07-17 NOTE — Transfer of Care (Signed)
 Immediate Anesthesia Transfer of Care Note  Patient: Embry GEREMY RISTER  Procedure(s) Performed: COLONOSCOPY WITH PROPOFOL  Patient Location: PACU  Anesthesia Type:MAC  Level of Consciousness: drowsy  Airway & Oxygen Therapy: Patient Spontanous Breathing  Post-op Assessment: Report given to RN and Post -op Vital signs reviewed and stable  Post vital signs: Reviewed and stable  Last Vitals:  Vitals Value Taken Time  BP 96/68 07/17/23 1155  Temp 35.8 C 07/17/23 1155  Pulse 60 07/17/23 1156  Resp 16 07/17/23 1156  SpO2 98 % 07/17/23 1156  Vitals shown include unfiled device data.  Last Pain:  Vitals:   07/17/23 1155  TempSrc: Temporal  PainSc: Asleep         Complications: No notable events documented.

## 2023-07-17 NOTE — Anesthesia Preprocedure Evaluation (Addendum)
 Anesthesia Evaluation  Patient identified by MRN, date of birth, ID band Patient awake    Reviewed: Allergy & Precautions, NPO status , Patient's Chart, lab work & pertinent test results  History of Anesthesia Complications Negative for: history of anesthetic complications  Airway Mallampati: I   Neck ROM: Full    Dental  (+) Missing, Chipped, Lower Dentures, Upper Dentures   Pulmonary Current Smoker (occasional, every month or so) and Patient abstained from smoking.   Pulmonary exam normal breath sounds clear to auscultation       Cardiovascular hypertension, Normal cardiovascular exam Rhythm:Regular Rate:Normal     Neuro/Psych negative neurological ROS     GI/Hepatic negative GI ROS,,,  Endo/Other  Prediabetes   Renal/GU Renal disease (CKD)     Musculoskeletal  (+) Arthritis ,    Abdominal   Peds  Hematology  (+) HIV  Anesthesia Other Findings   Reproductive/Obstetrics                             Anesthesia Physical Anesthesia Plan  ASA: 2  Anesthesia Plan: General   Post-op Pain Management:    Induction: Intravenous  PONV Risk Score and Plan: 1 and Propofol infusion, TIVA and Treatment may vary due to age or medical condition  Airway Management Planned: Natural Airway  Additional Equipment:   Intra-op Plan:   Post-operative Plan:   Informed Consent: I have reviewed the patients History and Physical, chart, labs and discussed the procedure including the risks, benefits and alternatives for the proposed anesthesia with the patient or authorized representative who has indicated his/her understanding and acceptance.     Interpreter used for interview  Plan Discussed with: CRNA  Anesthesia Plan Comments: (LMA/GETA backup discussed.  Patient consented for risks of anesthesia including but not limited to:  - adverse reactions to medications - damage to eyes, teeth, lips  or other oral mucosa - nerve damage due to positioning  - sore throat or hoarseness - damage to heart, brain, nerves, lungs, other parts of body or loss of life  Informed patient about role of CRNA in peri- and intra-operative care.  Patient voiced understanding.)       Anesthesia Quick Evaluation

## 2023-07-20 ENCOUNTER — Encounter: Payer: Self-pay | Admitting: Gastroenterology

## 2023-07-20 LAB — SURGICAL PATHOLOGY

## 2023-09-02 NOTE — Progress Notes (Signed)
 Diabetes Self-Management Education  Visit Type: First/Initial  Appt. Start Time: 0815 Appt. End Time: 1610  09/07/2023  [name], identified by name and date of birth, is a 63 y.o. male with a diagnosis of Diabetes:  .   ASSESSMENT  Gujarati Interpreter Id: [phone]  Patient is here today with his daughter, "Donald Barnett". Pt reports he works full time second shift mostly walking and standing.  Pt states a diagnosis of "prediabetes". Patient would like to learn more about improving his nutrition to improve blood sugar. Patient lives with wife and daughter and wife and reports shared shopping and states his wife does the cooking.  Pt reports his typical intake is three meals daily. Pt reports he accepts chicken and shrimp. Pt reports he has made the following changes including switching from sugar to splenda and  and increasing water intake. Pt reports he has enjoyed yoga in the past and would like to move forward with this type of physical activity.  All Pt's questions were answered during this encounter.   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 (Patient not taking: Reported on 07/17/2023), Disp: , Rfl:    oxyCODONE  (ROXICODONE ) 5 MG immediate release tablet, Take 1-2 tablets (5-10 mg total) by mouth every 4 (four) hours as needed for moderate pain or severe pain., Disp: 40 tablet, Rfl: 0   Study  - U9811 - bictegravir/emtricitabine /tenofovir  alafenamide (BIKTARVY ) 50-200-25 mg tablet ([place]), Take 1 tablet by mouth daily., Disp: , Rfl:    Labs noted:  No results found for: "HGBA1C" 7.3% per referring provider No results found for: "CHOL", "HDL", "LDLCALC", "LDLDIRECT", "TRIG", "CHOLHDL" No results found for: "VITAMINB12"  Wt Readings from Last 3 Encounters:  07/17/23 174 lb (78.9 kg)  12/17/21 169 lb 15.6 oz (77.1 kg)  12/05/21 169 lb 15.6 oz (77.1 kg)    There were no vitals taken for this visit. There is no height or weight on file to calculate BMI.   Diabetes Self-Management Education - 09/07/23 0827       Visit Information   Visit Type First/Initial      Initial Visit   Are you currently following a meal plan? No    Are you taking your medications as prescribed? Not on Medications      Psychosocial Assessment   Patient Belief/Attitude about Diabetes Motivated to manage diabetes    What is the hardest part about your diabetes right now, causing you the most concern, or is the most worrisome to you about your diabetes?   Making healty food and beverage choices    Self-care barriers English as a second language    Self-management support Doctor's office    Other persons present Patient;Family Member    Patient Concerns Nutrition/Meal planning    Special Needs None;Verbal instruction  Preferred Learning Style No preference indicated    Learning Readiness Change in progress    How often do you need to have someone help you when you read instructions, pamphlets, or other written materials from your doctor or pharmacy? 5 - Always      Pre-Education Assessment   Patient understands the diabetes disease and treatment process. Needs Instruction    Patient understands incorporating nutritional management into lifestyle. Needs Instruction    Patient undertands incorporating physical activity into lifestyle. Needs Instruction    Patient understands using medications  safely. Needs Instruction    Patient understands monitoring blood glucose, interpreting and using results Needs Instruction    Patient understands prevention, detection, and treatment of acute complications. Needs Instruction    Patient understands prevention, detection, and treatment of chronic complications. Needs Instruction    Patient understands how to develop strategies to address psychosocial issues. Needs Instruction    Patient understands how to develop strategies to promote health/change behavior. Needs Instruction      Complications   Last HgB A1C per patient/outside source 7.3 %    How often do you check your blood sugar? 0 times/day (not testing)    Have you had a dilated eye exam in the past 12 months? No    Have you had a dental exam in the past 12 months? No    Are you checking your feet? No      Dietary Intake   Breakfast 4 large and thin roti, egg, broccoli, 4 prathra, chai with milk, splenda    Lunch dahl, rice, potatoes, eggplant or 4 large and thin roti, broccoli, tomato, eggplant    Dinner 3 slices pizza with cheese, pinapple, japenjo, onion    Beverage(s) lemon water, beer, chai with milk, splenda, butter milk      Activity / Exercise   Activity / Exercise Type ADL's      Patient Education   Previous Diabetes Education No    Disease Pathophysiology Factors that contribute to the development of diabetes    Healthy Eating Plate Method    Being Active Role of exercise on diabetes management, blood pressure control and cardiac health.    Medications Other (comment)   n/a   Monitoring Daily foot exams    Chronic complications Relationship between chronic complications and blood glucose control    Diabetes Stress and Support Role of stress on diabetes    Lifestyle and Health Coping Lifestyle issues that need to be addressed for better diabetes care      Individualized Goals (developed by patient)   Nutrition Follow meal plan discussed    Physical Activity Exercise  1-2 times per week;15 minutes per day    Medications Not Applicable    Monitoring  Not Applicable    Problem Solving Eating Pattern;Addressing barriers to behavior change    Reducing Risk do foot checks daily    Health Coping Discuss barriers to diabetes care with support person/system (comment specifics as needed)      Post-Education Assessment   Patient understands the diabetes disease and treatment process. Needs Review    Patient understands incorporating nutritional management into lifestyle. Needs Review    Patient undertands incorporating physical activity into lifestyle. Needs Review    Patient understands using medications safely. Needs Review    Patient understands monitoring blood glucose, interpreting and using results Needs Review    Patient understands prevention, detection, and treatment of acute complications. Needs Review    Patient understands prevention, detection, and treatment  of chronic complications. Needs Review    Patient understands how to develop strategies to address psychosocial issues. Needs Review    Patient understands how to develop strategies to promote health/change behavior. Needs Review      Outcomes   Expected Outcomes Demonstrated interest in learning but significant barriers to change    Future DMSE 3-4 months    Program Status Not Completed             Individualized Plan for Diabetes Self-Management Training:   Learning Objective:  Patient will have a greater understanding of diabetes self-management. Patient education plan is to attend individual and/or group sessions per assessed needs and concerns.   Plan:   Patient Instructions  Yoga 20 minutes daily as tolerated with MD consent Aim for balanced meals using the plate planner   Expected Outcomes:  Demonstrated interest in learning but significant barriers to change  Education material provided:  Vegetarian Plate Planner  If problems or questions, patient to contact team via:   Phone  Future DSME appointment: 3-4 months

## 2023-09-07 ENCOUNTER — Encounter: Attending: Infectious Diseases | Admitting: Dietician

## 2023-09-07 DIAGNOSIS — E538 Deficiency of other specified B group vitamins: Secondary | ICD-10-CM

## 2023-09-07 DIAGNOSIS — E1065 Type 1 diabetes mellitus with hyperglycemia: Secondary | ICD-10-CM

## 2023-09-07 NOTE — Patient Instructions (Addendum)
 Yoga 20 minutes daily as tolerated with MD consent Aim for balanced meals using the plate planner
